# Patient Record
Sex: Male | Born: 1940
Health system: Southern US, Community
[De-identification: ages and names within clinical notes are randomized; demographics above are authoritative.]

## PROBLEM LIST (undated history)

## (undated) DIAGNOSIS — M179 Osteoarthritis of knee, unspecified: Secondary | ICD-10-CM

## (undated) DIAGNOSIS — E785 Hyperlipidemia, unspecified: Secondary | ICD-10-CM

## (undated) DIAGNOSIS — I25810 Atherosclerosis of coronary artery bypass graft(s) without angina pectoris: Secondary | ICD-10-CM

## (undated) DIAGNOSIS — M1710 Unilateral primary osteoarthritis, unspecified knee: Secondary | ICD-10-CM

## (undated) HISTORY — DX: Atherosclerosis of coronary artery bypass graft(s) without angina pectoris: I25.810

## (undated) HISTORY — PX: KNEE ARTHROSCOPY: SUR90

## (undated) HISTORY — DX: Unilateral primary osteoarthritis, unspecified knee: M17.10

## (undated) HISTORY — DX: Osteoarthritis of knee, unspecified: M17.9

## (undated) HISTORY — DX: Hyperlipidemia, unspecified: E78.5

## (undated) HISTORY — PX: CORONARY ARTERY BYPASS GRAFT: SHX141

---

## 2009-01-28 ENCOUNTER — Ambulatory Visit: Payer: Self-pay | Admitting: Cardiology

## 2009-02-03 ENCOUNTER — Ambulatory Visit: Payer: Self-pay | Admitting: Cardiology

## 2009-02-08 ENCOUNTER — Ambulatory Visit: Payer: Self-pay | Admitting: Cardiology

## 2009-02-08 ENCOUNTER — Inpatient Hospital Stay (HOSPITAL_COMMUNITY): Admission: RE | Admit: 2009-02-08 | Discharge: 2009-02-09 | Payer: Self-pay | Admitting: Cardiology

## 2009-02-08 ENCOUNTER — Ambulatory Visit: Payer: Self-pay | Admitting: Thoracic Surgery (Cardiothoracic Vascular Surgery)

## 2009-02-08 ENCOUNTER — Encounter: Payer: Self-pay | Admitting: Cardiology

## 2009-02-08 ENCOUNTER — Encounter: Payer: Self-pay | Admitting: Thoracic Surgery (Cardiothoracic Vascular Surgery)

## 2009-02-09 ENCOUNTER — Encounter: Payer: Self-pay | Admitting: Thoracic Surgery (Cardiothoracic Vascular Surgery)

## 2009-02-12 ENCOUNTER — Inpatient Hospital Stay (HOSPITAL_COMMUNITY)
Admission: RE | Admit: 2009-02-12 | Discharge: 2009-02-16 | Payer: Self-pay | Admitting: Thoracic Surgery (Cardiothoracic Vascular Surgery)

## 2009-02-15 ENCOUNTER — Encounter: Payer: Self-pay | Admitting: Cardiology

## 2009-03-12 ENCOUNTER — Ambulatory Visit: Payer: Self-pay | Admitting: Thoracic Surgery (Cardiothoracic Vascular Surgery)

## 2009-03-12 ENCOUNTER — Encounter: Payer: Self-pay | Admitting: Cardiology

## 2009-03-12 ENCOUNTER — Encounter
Admission: RE | Admit: 2009-03-12 | Discharge: 2009-03-12 | Payer: Self-pay | Admitting: Thoracic Surgery (Cardiothoracic Vascular Surgery)

## 2009-03-17 ENCOUNTER — Ambulatory Visit: Payer: Self-pay | Admitting: Cardiology

## 2009-06-02 ENCOUNTER — Encounter: Payer: Self-pay | Admitting: Cardiology

## 2009-09-11 DIAGNOSIS — I25810 Atherosclerosis of coronary artery bypass graft(s) without angina pectoris: Secondary | ICD-10-CM | POA: Insufficient documentation

## 2009-10-14 ENCOUNTER — Encounter (INDEPENDENT_AMBULATORY_CARE_PROVIDER_SITE_OTHER): Payer: Self-pay | Admitting: *Deleted

## 2009-10-15 ENCOUNTER — Ambulatory Visit: Payer: Self-pay | Admitting: Cardiology

## 2009-10-15 DIAGNOSIS — M1710 Unilateral primary osteoarthritis, unspecified knee: Secondary | ICD-10-CM

## 2009-10-15 DIAGNOSIS — E785 Hyperlipidemia, unspecified: Secondary | ICD-10-CM | POA: Insufficient documentation

## 2009-10-15 DIAGNOSIS — Z951 Presence of aortocoronary bypass graft: Secondary | ICD-10-CM

## 2009-10-15 DIAGNOSIS — I495 Sick sinus syndrome: Secondary | ICD-10-CM

## 2010-01-07 IMAGING — CR DG CHEST 1V PORT
1 series · 1 of 1 positions shown · non-contrast
Comparison: Chest radiograph 02/12/2009

CLINICAL DATA: CABG

PORTABLE CHEST - 1 VIEW

[view not recorded]
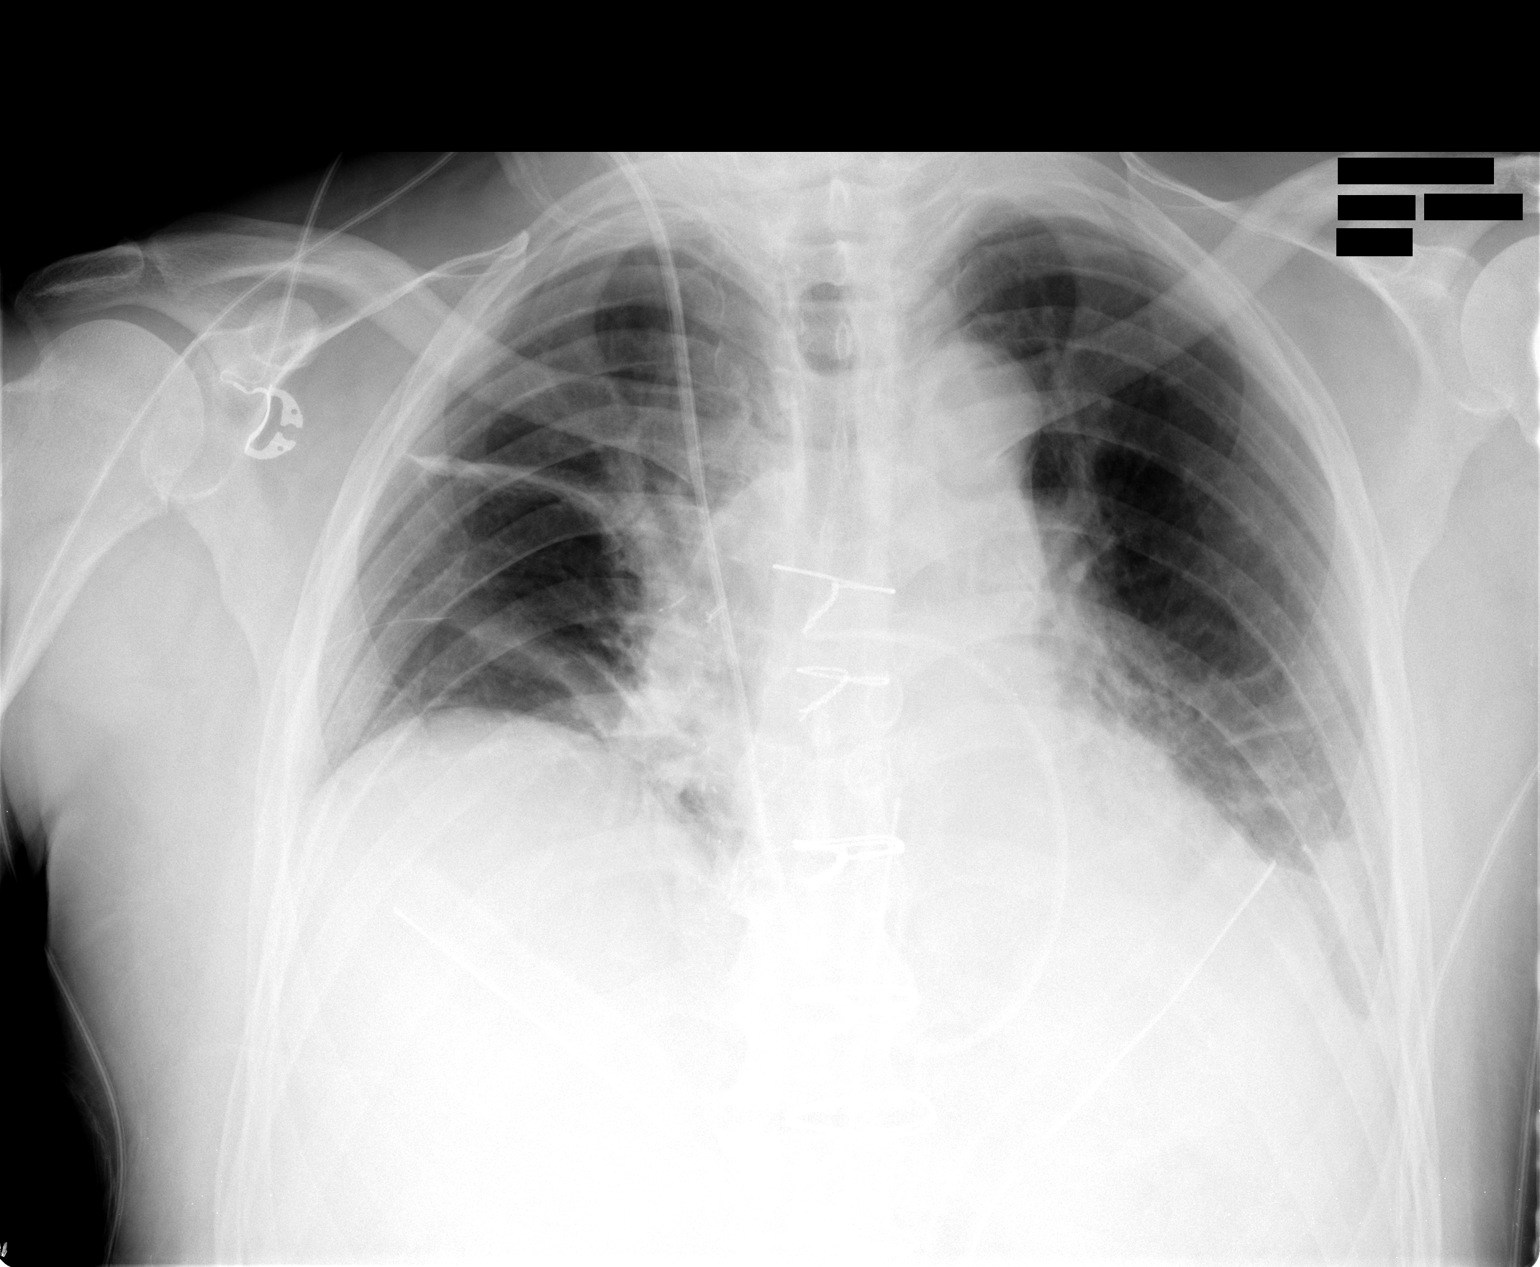

[1 of 1 positions shown; findings below may reference images not displayed]

FINDINGS: Midline sternotomy wires overlie stable cardiac
silhouette.  There is a Swan-Ganz catheter which tip has been
retracted into the more proximal right main pulmonary artery.
Bilateral chest tubes and mediastinal drain are unchanged.  There
is bibasilar atelectasis as well as a plate-like atelectasis in the
right lung which are not significantly changed.  Small left
effusion.
IMPRESSION: 1.  Adjustment of Swan-Ganz catheter as described.
2.  Bilateral atelectasis and left effusion.

## 2010-01-08 IMAGING — CR DG CHEST 1V PORT
1 series · 1 of 1 positions shown · non-contrast
Comparison: Chest radiograph 02/13/2009

CLINICAL DATA: CABG

PORTABLE CHEST - 1 VIEW

[view not recorded]
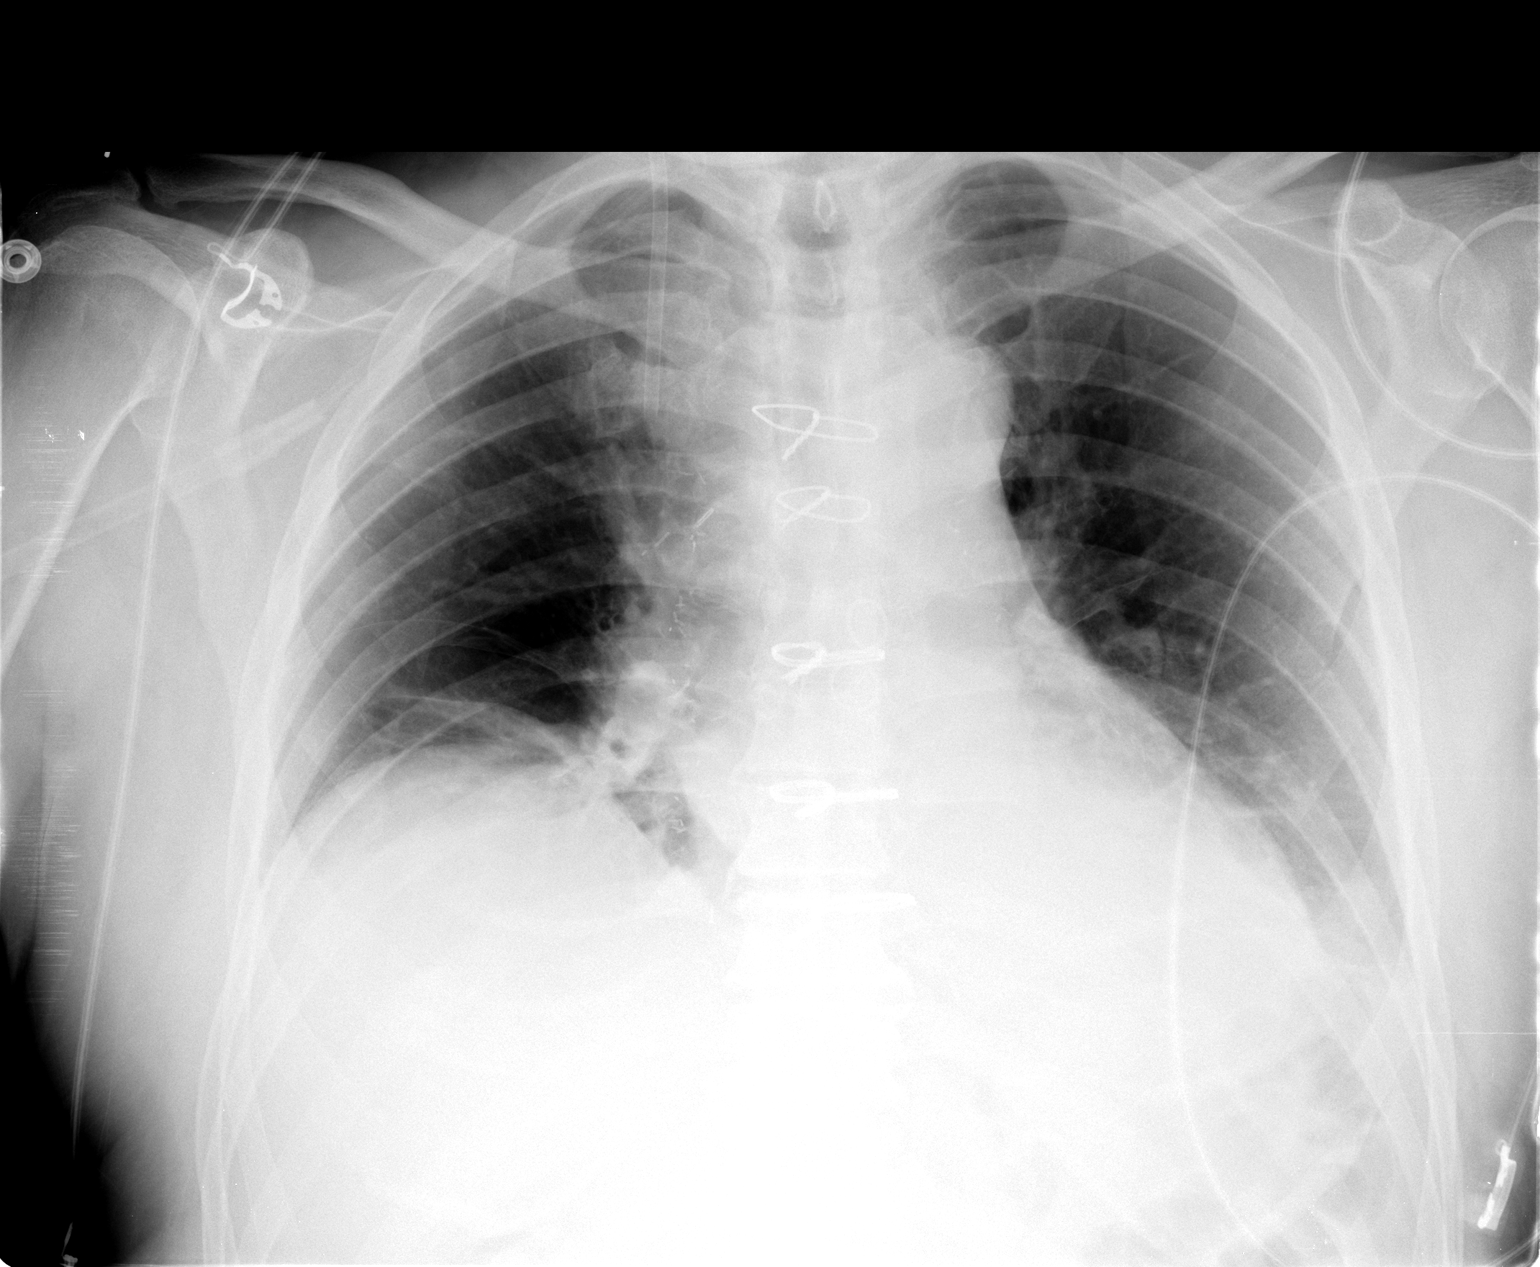

[1 of 1 positions shown; findings below may reference images not displayed]

FINDINGS: Sternotomy wires overlie stable enlarged cardiac
silhouette.  Interval retraction of Swan-Ganz catheter.  Right IJ
sheath remains.  There is bibasilar atelectasis and left effusion
not changed from prior.  No new pneumothorax. Oral removal of
bilateral chest tubes without evidence pneumothorax.
IMPRESSION: 1.  Retraction of Swan-Ganz catheter.
2.  Removal of chest tubes without evidence of pneumothorax.

## 2010-01-09 IMAGING — CR DG CHEST 2V
2 series · 2 of 2 positions shown · non-contrast
Comparison: 02/14/2009

CLINICAL DATA: CAD.  CABG.

CHEST - 2 VIEW

[w chest pa]
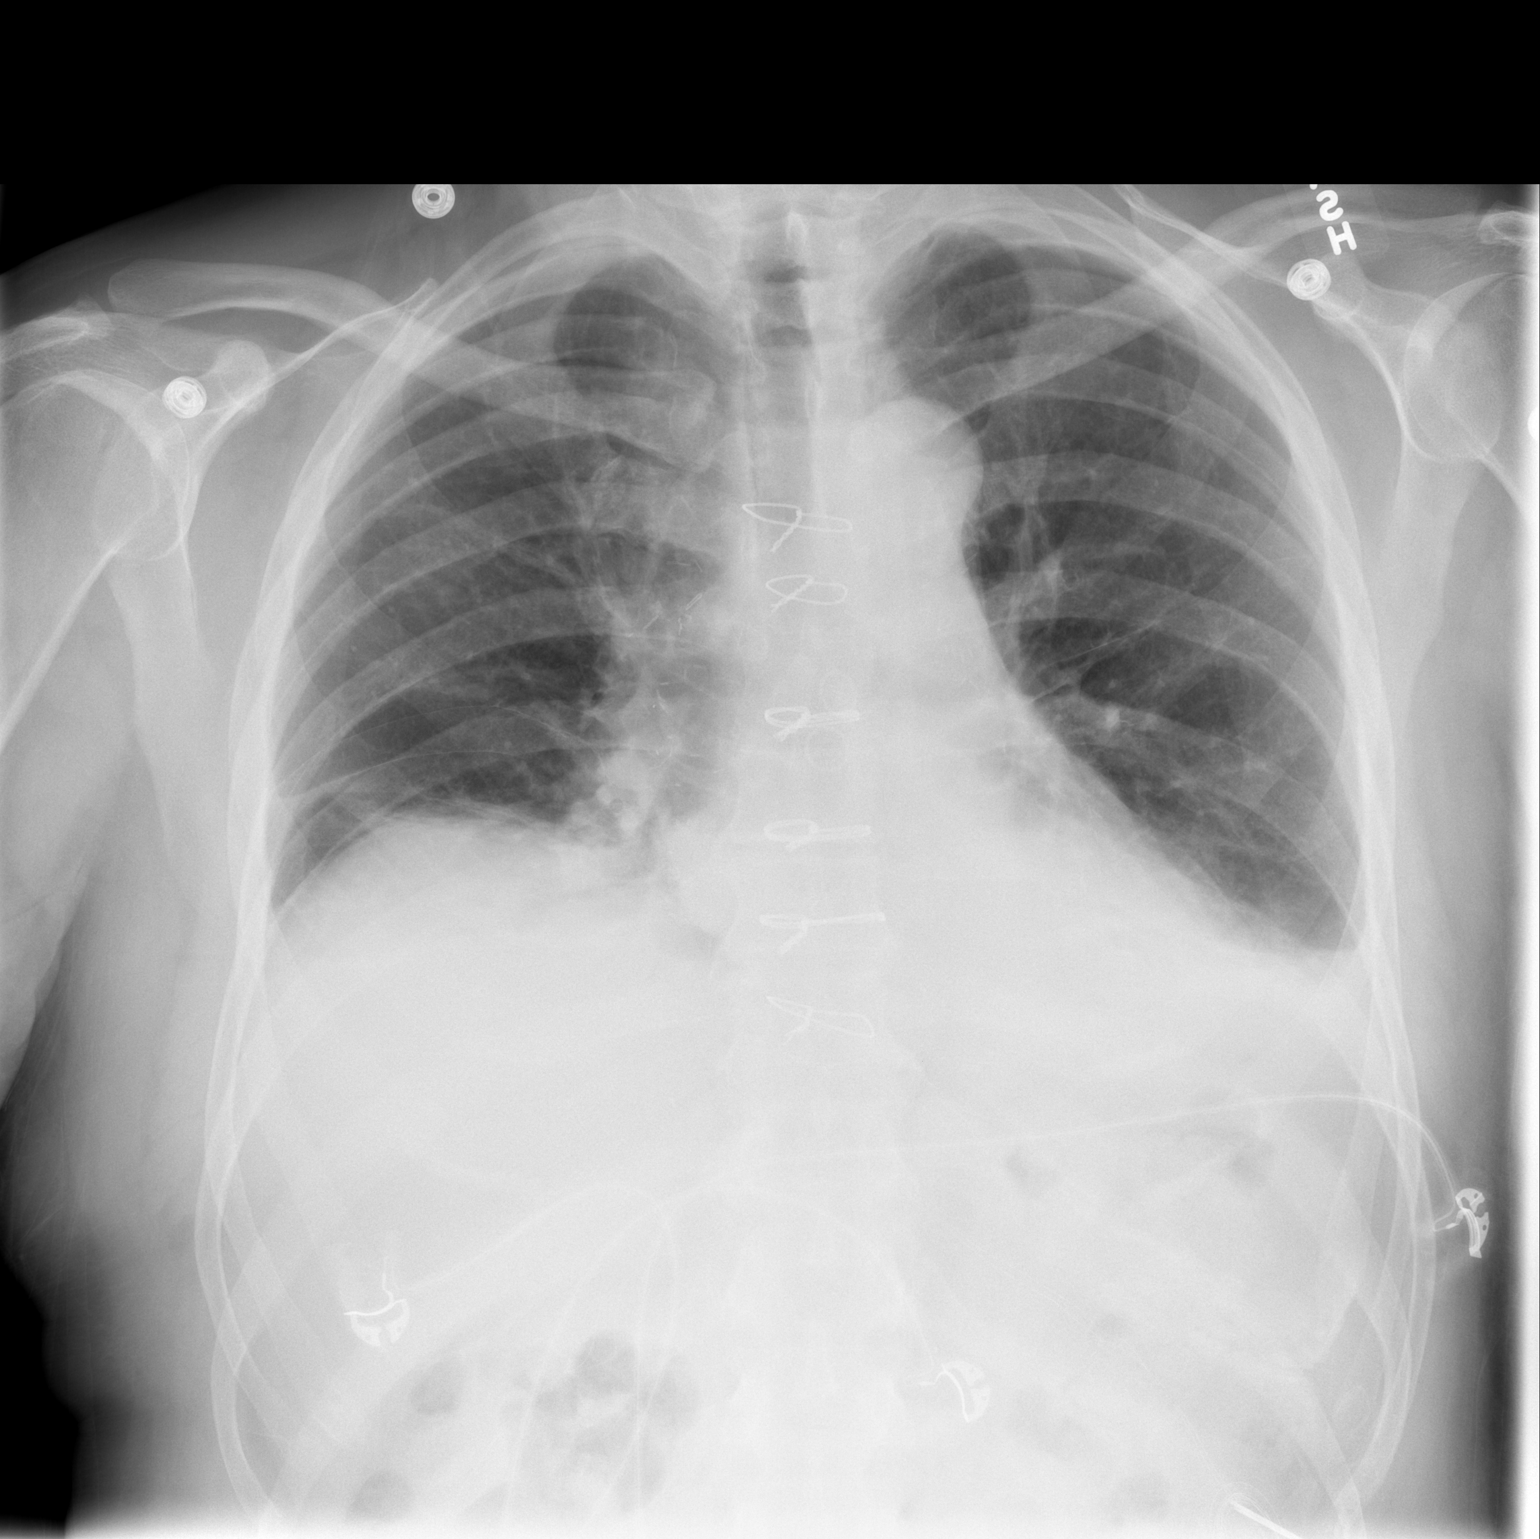

[w chest lat]
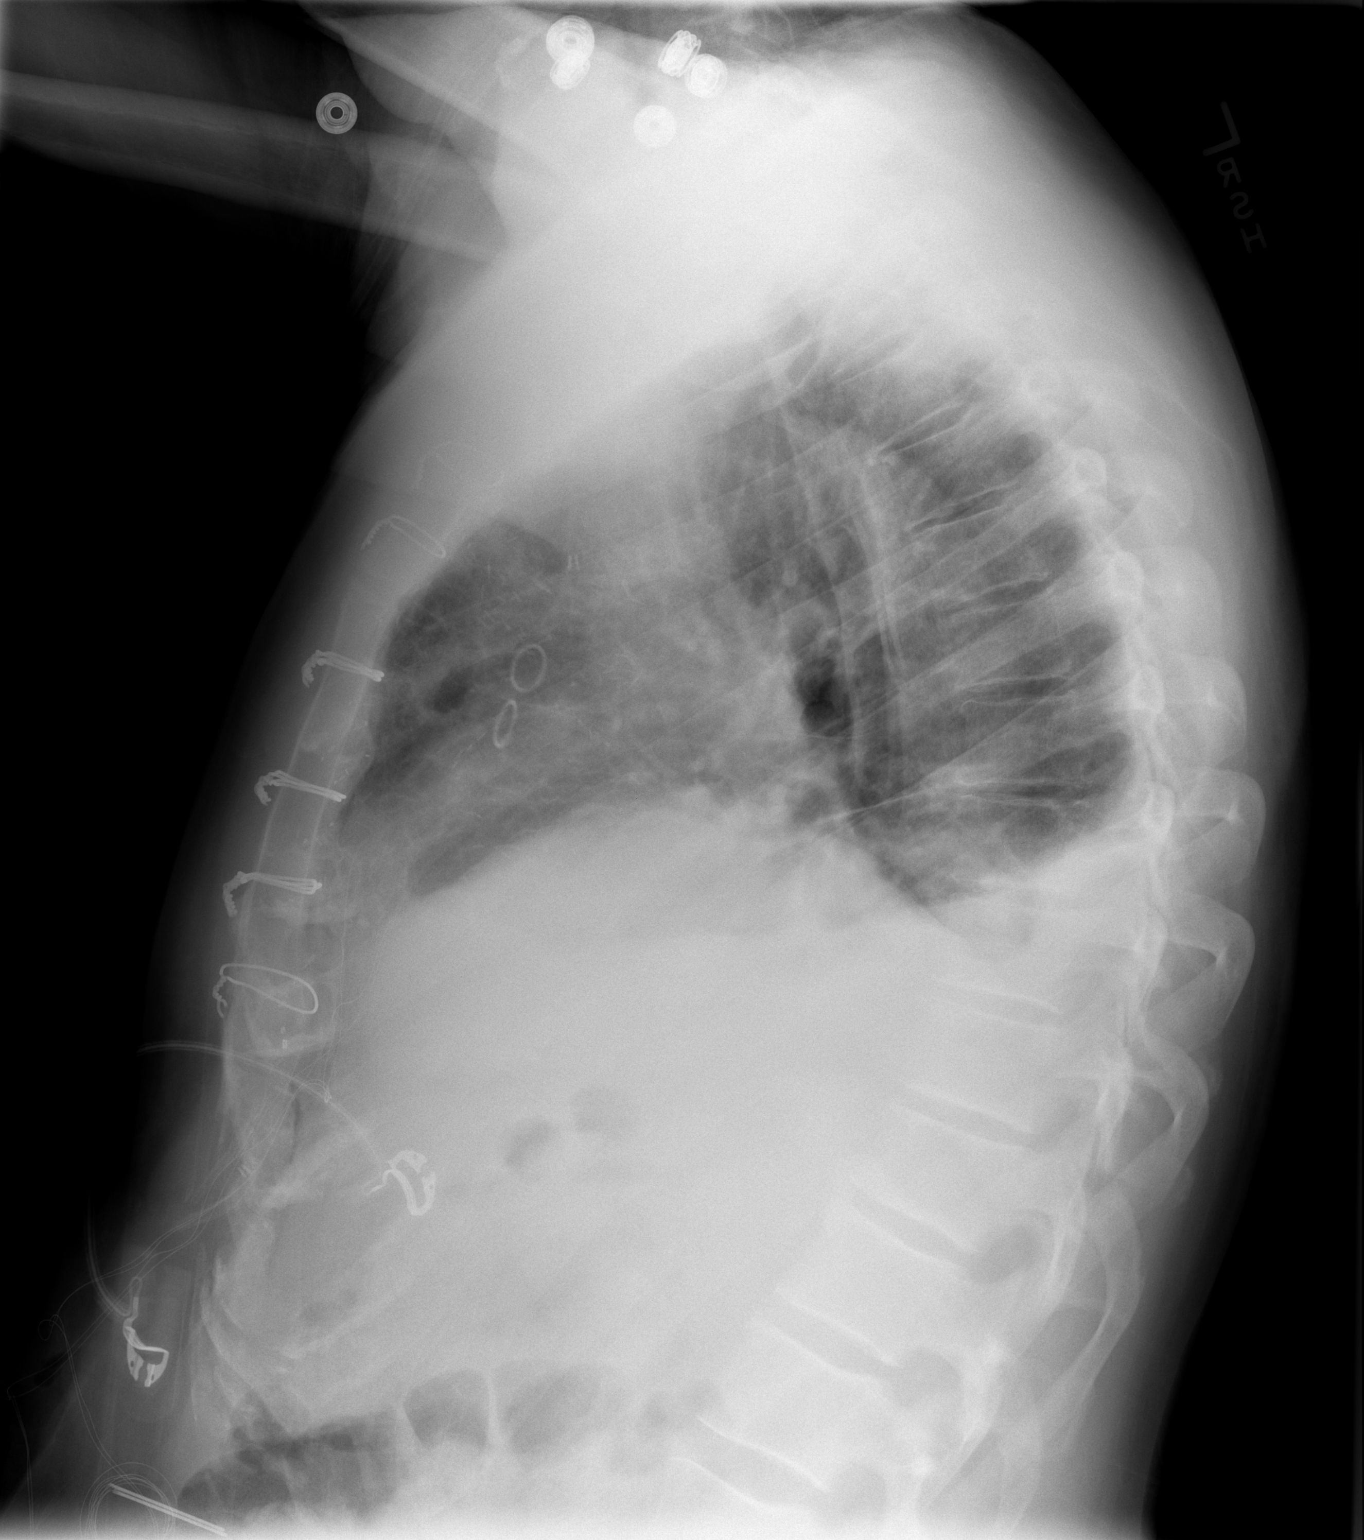

[2 of 2 positions shown; findings below may reference images not displayed]

FINDINGS: Small pleural effusions.  Atelectasis at the bases is
again noted.  Slight improved aeration at the bases.  Swan-Ganz
catheter sheath has been removed.  No pneumothorax.
IMPRESSION: Small pleural effusions.  Mild atelectasis at the bases.  No
pneumothorax.

## 2010-05-10 ENCOUNTER — Ambulatory Visit: Payer: Self-pay | Admitting: Thoracic Surgery (Cardiothoracic Vascular Surgery)

## 2010-07-19 ENCOUNTER — Encounter: Payer: Self-pay | Admitting: Cardiology

## 2010-10-04 ENCOUNTER — Encounter: Payer: Self-pay | Admitting: Cardiology

## 2010-10-20 ENCOUNTER — Ambulatory Visit: Payer: Self-pay | Admitting: Cardiology

## 2010-12-04 HISTORY — PX: JOINT REPLACEMENT: SHX530

## 2011-01-03 NOTE — Letter (Signed)
Summary: External Correspondence/ [facility]  External Correspondence/ [facility]   Imported By: Dorise Hiss 10/11/2010 10:52:24  _____________________________________________________________________  External Attachment:    Type:   Image     Comment:   External Document

## 2011-01-03 NOTE — Assessment & Plan Note (Signed)
Summary: 1 YR FU REMINDER-SRS  Medications Added CRESTOR 40 MG TABS (ROSUVASTATIN CALCIUM) Take 1 /2 tablet by mouth once a day VITAMIN D3 1000 UNIT TABS (CHOLECALCIFEROL) Take 1 tablet by mouth once a day FISH OIL 1000 MG CAPS (OMEGA-3 FATTY ACIDS) Take 1 tablet by mouth once a day      Allergies Added: ! MORPHINE  Visit Type:  Follow-up Primary Danial Hlavac:  Reuel Boom   History of Present Illness: the patient is a pleasant 70 year old male with a history of coronary artery disease. The patient status post coronary bypass grafting in March of 2010. He underwent 5 vessel bypass grafting.patient had laboratory work done in August of this year. Renal function is within normal limits. Electrolytes are within normal limits. Total cholesterol was 131, triglycerides 57 HDL cholesterol 61 and LDL cholesterol 59 on 20 mg of Crestor.  cardiac rehab continue. Works with horses. No scp. No sob.watches his food. Broiled and baked. No fats no meats.  No palpitations or syncope. Diziness with cough.  Ecg normal. Not on metoprololnymore.   Preventive Screening-Counseling & Management  Alcohol-Tobacco     Smoking Status: never  Current Medications (verified): 1)  Crestor 40 Mg Tabs (Rosuvastatin Calcium) .... Take 1 /2 Tablet By Mouth Once A Day 2)  Pantoprazole Sodium 40 Mg Tbec (Pantoprazole Sodium) .... Take 1 Tablet By Mouth Once A Day 3)  Tylenol Arthritis Pain 650 Mg Cr-Tabs (Acetaminophen) .... Take 2 Tablets By Mouth Two Times A Day 4)  Glucosamine 500 Mg Caps (Glucosamine Sulfate) .... Take 1 Capsule By Mouth Two Times A Day 5)  Vitamin D3 1000 Unit Tabs (Cholecalciferol) .... Take 1 Tablet By Mouth Once A Day 6)  Multivitamins  Tabs (Multiple Vitamin) .... Take 1 Tablet By Mouth Once A Day 7)  Aspirin Ec 325 Mg Tbec (Aspirin) .... Take One Tablet By Mouth Daily 8)  Fish Oil 1000 Mg Caps (Omega-3 Fatty Acids) .... Take 1 Tablet By Mouth Once A Day  Allergies (verified): 1)  !  Morphine  Comments:  Nurse/Medical Assistant: The patient's medication list and allergies were reviewed with the patient and were updated in the Medication and Allergy Lists.  Past History:  Past Medical History: Last updated: 10/15/2009 CAD, ARTERY BYPASS GRAFT (ICD-414.04) 1. Status post coronary artery bypass grafting. 2. Normal left ventricular function. 3. Increased left atrial dilatation. 4. Scheduled elective surgery for bilateral knee replacement secondary     to osteoarthritis.   Past Surgical History: Last updated: 09/11/2009 Knee Arthroscopy oral CABG  Family History: Last updated: 09/11/2009 Family History of Coronary Artery Disease:   Social History: Last updated: 09/11/2009 Tobacco Use - No.  Alcohol Use - no Drug Use - no  Risk Factors: Smoking Status: never (10/20/2010)  Past Cardiac History:  Cardiac Interventions: PRIMARY ADMITTING DIAGNOSIS:  Severe three-vessel coronary artery disease.   ADDITIONAL/DISCHARGE DIAGNOSES: 1. Severe three-vessel coronary artery disease. 2. History of gastroesophageal reflux. 3. Arthritis. 4. Depression. 5. History of erectile dysfunction. 6. Status post esophageal dilatation. 7. Peptic ulcer disease.   PROCEDURES PERFORMED: 1. Coronary artery bypass grafting x5 (left internal mammary artery to     the left anterior descending, right internal mammary artery to the     distal right coronary artery, saphenous vein graft to the first     diagonal, sequential saphenous vein graft to the first and second     obtuse marginals). 2. Endoscopic vein harvest, right thigh.  02/15/2009:  CABG-4 vessels (coronary artery bypass grafting x5)  Review  of Systems  The patient denies fatigue, malaise, fever, weight gain/loss, vision loss, decreased hearing, hoarseness, chest pain, palpitations, shortness of breath, prolonged cough, wheezing, sleep apnea, coughing up blood, abdominal pain, blood in stool, nausea, vomiting,  diarrhea, heartburn, incontinence, blood in urine, muscle weakness, joint pain, leg swelling, rash, skin lesions, headache, fainting, dizziness, depression, anxiety, enlarged lymph nodes, easy bruising or bleeding, and environmental allergies.    Vital Signs:  Patient profile:   70 year old male Height:      70 inches Weight:      199 pounds BMI:     28.66 Pulse rate:   63 / minute BP sitting:   138 / 84  (left arm) Cuff size:   regular  Vitals Entered By: Carlye Grippe (October 20, 2010 2:11 PM)  Nutrition Counseling: Patient's BMI is greater than 25 and therefore counseled on weight management options.  Physical Exam  Additional Exam:  General: Well-developed, well-nourished in no distress head: Normocephalic and atraumatic eyes PERRLA/EOMI intact, conjunctiva and lids normal nose: No deformity or lesions mouth normal dentition, normal posterior pharynx neck: Supple, no JVD.  No masses, thyromegaly or abnormal cervical nodes lungs: Normal breath sounds bilaterally without wheezing.  Normal percussion heart: regular rate and rhythm with normal S1 and S2, no S3 or S4.  PMI is normal.  No pathological murmurs abdomen: Normal bowel sounds, abdomen is soft and nontender without masses, organomegaly or hernias noted.  No hepatosplenomegaly musculoskeletal: Back normal, normal gait muscle strength and tone normal pulsus: Pulse is normal in all 4 extremities Extremities: No peripheral pitting edema neurologic: Alert and oriented x 3 skin: Intact without lesions or rashes cervical nodes: No significant adenopathy psychologic: Normal affect    EKG  Procedure date:  10/20/2010  Findings:      normal sinus rhythm no acute ischemic changes.  Impression & Recommendations:  Problem # 1:  CAD, ARTERY BYPASS GRAFT (ICD-414.04) the patient is status post coronary bypass grafting and doing well. He reports no chest pain or short of breath. He does not need ischemia testing at the  present time. The following medications were removed from the medication list:    Metoprolol Succinate 25 Mg Xr24h-tab (Metoprolol succinate) .Marland Kitchen... Take one tablet by mouth daily His updated medication list for this problem includes:    Aspirin Ec 325 Mg Tbec (Aspirin) .Marland Kitchen... Take one tablet by mouth daily  Orders: EKG w/ Interpretation (93000)  Problem # 2:  SINUS BRADYCARDIA (ICD-427.81) patient had sinus bradycardia and is currently not on metoprolol anymore. The following medications were removed from the medication list:    Metoprolol Succinate 25 Mg Xr24h-tab (Metoprolol succinate) .Marland Kitchen... Take one tablet by mouth daily His updated medication list for this problem includes:    Aspirin Ec 325 Mg Tbec (Aspirin) .Marland Kitchen... Take one tablet by mouth daily  Problem # 3:  DYSLIPIDEMIA (ICD-272.4) lipid panel is remarkably good. LDL cholesterol is 59 HDL cholesterol is 61. Continue Crestor 20 mg p.o. q. daily His updated medication list for this problem includes:    Crestor 40 Mg Tabs (Rosuvastatin calcium) .Marland Kitchen... Take 1 /2 tablet by mouth once a day  Patient Instructions: 1)  Your physician recommends that you continue on your current medications as directed. Please refer to the Current Medication list given to you today. 2)  Follow up in  6 months

## 2011-01-26 ENCOUNTER — Encounter: Payer: Self-pay | Admitting: Cardiology

## 2011-02-21 NOTE — Letter (Signed)
Summary: External Correspondence/  OV DR. DANIEL  External Correspondence/  OV DR. DANIEL   Imported By: Dorise Hiss 02/15/2011 10:24:18  _____________________________________________________________________  External Attachment:    Type:   Image     Comment:   External Document

## 2011-03-16 LAB — CBC
HCT: 28.9 % — ABNORMAL LOW (ref 39.0–52.0)
HCT: 32.4 % — ABNORMAL LOW (ref 39.0–52.0)
HCT: 40.5 % (ref 39.0–52.0)
Hemoglobin: 10.8 g/dL — ABNORMAL LOW (ref 13.0–17.0)
Hemoglobin: 12.9 g/dL — ABNORMAL LOW (ref 13.0–17.0)
Hemoglobin: 13.9 g/dL (ref 13.0–17.0)
MCHC: 34.2 g/dL (ref 30.0–36.0)
MCHC: 34.7 g/dL (ref 30.0–36.0)
MCHC: 34.8 g/dL (ref 30.0–36.0)
MCHC: 34.8 g/dL (ref 30.0–36.0)
MCHC: 34.9 g/dL (ref 30.0–36.0)
MCHC: 35 g/dL (ref 30.0–36.0)
MCV: 89.3 fL (ref 78.0–100.0)
MCV: 89.6 fL (ref 78.0–100.0)
MCV: 89.6 fL (ref 78.0–100.0)
MCV: 89.9 fL (ref 78.0–100.0)
MCV: 90.2 fL (ref 78.0–100.0)
MCV: 90.4 fL (ref 78.0–100.0)
MCV: 90.6 fL (ref 78.0–100.0)
Platelets: 123 10*3/uL — ABNORMAL LOW (ref 150–400)
Platelets: 138 10*3/uL — ABNORMAL LOW (ref 150–400)
Platelets: 149 10*3/uL — ABNORMAL LOW (ref 150–400)
Platelets: 206 10*3/uL (ref 150–400)
Platelets: 228 10*3/uL (ref 150–400)
RBC: 3.48 MIL/uL — ABNORMAL LOW (ref 4.22–5.81)
RBC: 4.12 MIL/uL — ABNORMAL LOW (ref 4.22–5.81)
RBC: 4.48 MIL/uL (ref 4.22–5.81)
RDW: 12.8 % (ref 11.5–15.5)
RDW: 12.9 % (ref 11.5–15.5)
RDW: 12.9 % (ref 11.5–15.5)
RDW: 12.9 % (ref 11.5–15.5)
WBC: 10.7 10*3/uL — ABNORMAL HIGH (ref 4.0–10.5)
WBC: 11.2 10*3/uL — ABNORMAL HIGH (ref 4.0–10.5)
WBC: 9.5 10*3/uL (ref 4.0–10.5)

## 2011-03-16 LAB — BASIC METABOLIC PANEL
BUN: 7 mg/dL (ref 6–23)
BUN: 11 mg/dL (ref 6–23)
BUN: 7 mg/dL (ref 6–23)
BUN: 7 mg/dL (ref 6–23)
CO2: 23 mEq/L (ref 19–32)
CO2: 24 mEq/L (ref 19–32)
CO2: 27 mEq/L (ref 19–32)
Calcium: 8 mg/dL — ABNORMAL LOW (ref 8.4–10.5)
Calcium: 8.3 mg/dL — ABNORMAL LOW (ref 8.4–10.5)
Calcium: 9.1 mg/dL (ref 8.4–10.5)
Chloride: 107 mEq/L (ref 96–112)
Chloride: 104 mEq/L (ref 96–112)
Chloride: 106 mEq/L (ref 96–112)
Chloride: 111 mEq/L (ref 96–112)
Creatinine, Ser: 0.81 mg/dL (ref 0.4–1.5)
Creatinine, Ser: 0.65 mg/dL (ref 0.4–1.5)
Creatinine, Ser: 0.69 mg/dL (ref 0.4–1.5)
Creatinine, Ser: 0.72 mg/dL (ref 0.4–1.5)
Creatinine, Ser: 0.92 mg/dL (ref 0.4–1.5)
GFR calc Af Amer: >60 mL/min (ref >60)
GFR calc Af Amer: >60 mL/min (ref >60)
GFR calc non Af Amer: >60 mL/min (ref >60)
GFR calc non Af Amer: >60 mL/min (ref >60)
Glucose, Bld: 104 mg/dL — ABNORMAL HIGH (ref 70–99)
Glucose, Bld: 113 mg/dL — ABNORMAL HIGH (ref 70–99)
Glucose, Bld: 114 mg/dL — ABNORMAL HIGH (ref 70–99)
Potassium: 3.9 mEq/L (ref 3.5–5.1)
Potassium: 3.9 mEq/L (ref 3.5–5.1)

## 2011-03-16 LAB — POCT I-STAT 3, ART BLOOD GAS (G3+)
Acid-base deficit: 2 mmol/L (ref 0.0–2.0)
Bicarbonate: 20 mEq/L (ref 20.0–24.0)
O2 Saturation: 100 %
O2 Saturation: 99 %
Patient temperature: 35.7
TCO2: 21 mmol/L (ref 0–100)
TCO2: 23 mmol/L (ref 0–100)
pCO2 arterial: 36.2 mmHg (ref 35.0–45.0)
pH, Arterial: 7.394 (ref 7.350–7.450)
pO2, Arterial: 134 mmHg — ABNORMAL HIGH (ref 80.0–100.0)

## 2011-03-16 LAB — POCT I-STAT 4, (NA,K, GLUC, HGB,HCT)
Glucose, Bld: 105 mg/dL — ABNORMAL HIGH (ref 70–99)
Glucose, Bld: 105 mg/dL — ABNORMAL HIGH (ref 70–99)
Glucose, Bld: 110 mg/dL — ABNORMAL HIGH (ref 70–99)
HCT: 25 % — ABNORMAL LOW (ref 39.0–52.0)
HCT: 27 % — ABNORMAL LOW (ref 39.0–52.0)
HCT: 40 % (ref 39.0–52.0)
Hemoglobin: 13.6 g/dL (ref 13.0–17.0)
Hemoglobin: 14.6 g/dL (ref 13.0–17.0)
Hemoglobin: 8.5 g/dL — ABNORMAL LOW (ref 13.0–17.0)
Potassium: 3.4 mEq/L — ABNORMAL LOW (ref 3.5–5.1)
Potassium: 3.4 mEq/L — ABNORMAL LOW (ref 3.5–5.1)
Potassium: 3.6 mEq/L (ref 3.5–5.1)
Potassium: 4 mEq/L (ref 3.5–5.1)
Sodium: 135 mEq/L (ref 135–145)
Sodium: 136 mEq/L (ref 135–145)
Sodium: 140 mEq/L (ref 135–145)

## 2011-03-16 LAB — BLOOD GAS, ARTERIAL
Acid-Base Excess: 1.1 mmol/L (ref 0.0–2.0)
Patient temperature: 98.6
pCO2 arterial: 40.1 mmHg (ref 35.0–45.0)
pH, Arterial: 7.415 (ref 7.350–7.450)

## 2011-03-16 LAB — GLUCOSE, CAPILLARY
Glucose-Capillary: 100 mg/dL — ABNORMAL HIGH (ref 70–99)
Glucose-Capillary: 101 mg/dL — ABNORMAL HIGH (ref 70–99)
Glucose-Capillary: 116 mg/dL — ABNORMAL HIGH (ref 70–99)
Glucose-Capillary: 121 mg/dL — ABNORMAL HIGH (ref 70–99)
Glucose-Capillary: 84 mg/dL (ref 70–99)

## 2011-03-16 LAB — CREATININE, SERUM: GFR calc Af Amer: >60 mL/min (ref >60)

## 2011-03-16 LAB — URINALYSIS, ROUTINE W REFLEX MICROSCOPIC
Bilirubin Urine: NEGATIVE
Glucose, UA: NEGATIVE mg/dL
Ketones, ur: NEGATIVE mg/dL
Protein, ur: NEGATIVE mg/dL
pH: 6 (ref 5.0–8.0)

## 2011-03-16 LAB — MAGNESIUM
Magnesium: 2 mg/dL (ref 1.5–2.5)
Magnesium: 2.1 mg/dL (ref 1.5–2.5)

## 2011-03-16 LAB — TYPE AND SCREEN
ABO/RH(D): O NEG
Antibody Screen: NEGATIVE

## 2011-03-16 LAB — POCT I-STAT, CHEM 8
BUN: 12 mg/dL (ref 6–23)
Calcium, Ion: 1.23 mmol/L (ref 1.12–1.32)
Glucose, Bld: 134 mg/dL — ABNORMAL HIGH (ref 70–99)
TCO2: 23 mmol/L (ref 0–100)

## 2011-03-16 LAB — COMPREHENSIVE METABOLIC PANEL
ALT: 17 U/L (ref 0–53)
Albumin: 3.4 g/dL — ABNORMAL LOW (ref 3.5–5.2)
Alkaline Phosphatase: 52 U/L (ref 39–117)
Calcium: 9.1 mg/dL (ref 8.4–10.5)
GFR calc Af Amer: >60 mL/min (ref >60)
GFR calc non Af Amer: >60 mL/min (ref >60)
Glucose, Bld: 105 mg/dL — ABNORMAL HIGH (ref 70–99)
Sodium: 140 mEq/L (ref 135–145)
Total Bilirubin: 0.9 mg/dL (ref 0.3–1.2)
Total Protein: 5.9 g/dL — ABNORMAL LOW (ref 6.0–8.3)

## 2011-03-16 LAB — DIFFERENTIAL
Basophils Absolute: 0 10*3/uL (ref 0.0–0.1)
Basophils Relative: 1 % (ref 0–1)
Eosinophils Absolute: 0.1 10*3/uL (ref 0.0–0.7)
Neutro Abs: 3.5 10*3/uL (ref 1.7–7.7)
Neutrophils Relative %: 55 % (ref 43–77)

## 2011-03-16 LAB — ABO/RH: ABO/RH(D): O NEG

## 2011-03-16 LAB — PROTIME-INR
INR: 1 (ref 0.00–1.49)
INR: 1.3 (ref 0.00–1.49)
Prothrombin Time: 13.5 seconds (ref 11.6–15.2)

## 2011-03-16 LAB — APTT: aPTT: 33 seconds (ref 24–37)

## 2011-03-18 ENCOUNTER — Telehealth: Payer: Self-pay | Admitting: *Deleted

## 2011-03-18 NOTE — Telephone Encounter (Signed)
Received fax requesting cardiac clearance for Left TKA-Medial & Lateral w/wo Patella Resurfacing.  Looks like patient is only on Aspirin & las t seen 10/2010.  Has 6 month f/u scheduled for June.

## 2011-03-20 NOTE — Telephone Encounter (Signed)
If patient has no unstable chest pain symptoms, then he should be able to be cleared for total knee replacement. He had bypass surgery less than 2 years ago and therefore does not need any stress testing prior to the procedure.

## 2011-03-22 ENCOUNTER — Telehealth: Payer: Self-pay | Admitting: Cardiology

## 2011-03-23 NOTE — Telephone Encounter (Signed)
Note faxed regarding below.

## 2011-03-29 NOTE — Telephone Encounter (Signed)
Received call that [name] did not receive fax for clearance. Refaxed today.

## 2011-03-30 ENCOUNTER — Other Ambulatory Visit: Payer: Self-pay | Admitting: Orthopedic Surgery

## 2011-03-30 ENCOUNTER — Ambulatory Visit (HOSPITAL_COMMUNITY)
Admission: RE | Admit: 2011-03-30 | Discharge: 2011-03-30 | Disposition: A | Discharge status: Home / Self Care | Payer: BC Managed Care – PPO | Source: Ambulatory Visit | Attending: Orthopedic Surgery | Admitting: Orthopedic Surgery

## 2011-03-30 ENCOUNTER — Encounter (HOSPITAL_COMMUNITY): Payer: BC Managed Care – PPO

## 2011-03-30 ENCOUNTER — Other Ambulatory Visit (HOSPITAL_COMMUNITY): Payer: Self-pay | Admitting: Orthopedic Surgery

## 2011-03-30 DIAGNOSIS — M1710 Unilateral primary osteoarthritis, unspecified knee: Secondary | ICD-10-CM | POA: Insufficient documentation

## 2011-03-30 DIAGNOSIS — I2510 Atherosclerotic heart disease of native coronary artery without angina pectoris: Secondary | ICD-10-CM | POA: Insufficient documentation

## 2011-03-30 DIAGNOSIS — Z01811 Encounter for preprocedural respiratory examination: Secondary | ICD-10-CM

## 2011-03-30 DIAGNOSIS — Z951 Presence of aortocoronary bypass graft: Secondary | ICD-10-CM | POA: Insufficient documentation

## 2011-03-30 DIAGNOSIS — Z01818 Encounter for other preprocedural examination: Secondary | ICD-10-CM | POA: Insufficient documentation

## 2011-03-30 DIAGNOSIS — Z01812 Encounter for preprocedural laboratory examination: Secondary | ICD-10-CM | POA: Insufficient documentation

## 2011-03-30 LAB — COMPREHENSIVE METABOLIC PANEL
ALT: 23 U/L (ref 0–53)
AST: 20 U/L (ref 0–37)
Alkaline Phosphatase: 63 U/L (ref 39–117)
Calcium: 9 mg/dL (ref 8.4–10.5)
GFR calc Af Amer: >60 mL/min (ref >60)
Potassium: 4 mEq/L (ref 3.5–5.1)
Sodium: 138 mEq/L (ref 135–145)
Total Protein: 7.1 g/dL (ref 6.0–8.3)

## 2011-03-30 LAB — CBC
HCT: 43.5 % (ref 39.0–52.0)
Hemoglobin: 14.6 g/dL (ref 13.0–17.0)
MCHC: 33.6 g/dL (ref 30.0–36.0)
RDW: 12.7 % (ref 11.5–15.5)
WBC: 8.5 10*3/uL (ref 4.0–10.5)

## 2011-03-30 LAB — URINALYSIS, ROUTINE W REFLEX MICROSCOPIC
Glucose, UA: NEGATIVE mg/dL
Ketones, ur: NEGATIVE mg/dL
Nitrite: NEGATIVE
pH: 5.5 (ref 5.0–8.0)

## 2011-03-30 LAB — SURGICAL PCR SCREEN
MRSA, PCR: NEGATIVE
Staphylococcus aureus: POSITIVE — AB

## 2011-04-03 ENCOUNTER — Inpatient Hospital Stay (HOSPITAL_COMMUNITY)
Admission: RE | Admit: 2011-04-03 | Discharge: 2011-04-06 | DRG: 209 | Disposition: A | Discharge status: Skilled Nursing Facility | Payer: BC Managed Care – PPO | Source: Ambulatory Visit | Attending: Orthopedic Surgery | Admitting: Orthopedic Surgery

## 2011-04-03 DIAGNOSIS — E871 Hypo-osmolality and hyponatremia: Secondary | ICD-10-CM | POA: Diagnosis not present

## 2011-04-03 DIAGNOSIS — M1710 Unilateral primary osteoarthritis, unspecified knee: Principal | ICD-10-CM | POA: Diagnosis present

## 2011-04-03 DIAGNOSIS — I2510 Atherosclerotic heart disease of native coronary artery without angina pectoris: Secondary | ICD-10-CM | POA: Diagnosis present

## 2011-04-03 DIAGNOSIS — K219 Gastro-esophageal reflux disease without esophagitis: Secondary | ICD-10-CM | POA: Diagnosis present

## 2011-04-03 DIAGNOSIS — M129 Arthropathy, unspecified: Secondary | ICD-10-CM | POA: Diagnosis present

## 2011-04-03 DIAGNOSIS — Z951 Presence of aortocoronary bypass graft: Secondary | ICD-10-CM

## 2011-04-03 DIAGNOSIS — E876 Hypokalemia: Secondary | ICD-10-CM | POA: Diagnosis not present

## 2011-04-03 LAB — TYPE AND SCREEN
ABO/RH(D): O NEG
Antibody Screen: NEGATIVE

## 2011-04-03 LAB — ABO/RH: ABO/RH(D): O NEG

## 2011-04-04 LAB — BASIC METABOLIC PANEL
CO2: 28 mEq/L (ref 19–32)
Calcium: 8.1 mg/dL — ABNORMAL LOW (ref 8.4–10.5)
Creatinine, Ser: 0.71 mg/dL (ref 0.4–1.5)
GFR calc Af Amer: >60 mL/min (ref >60)
GFR calc non Af Amer: >60 mL/min (ref >60)

## 2011-04-04 LAB — CBC
MCH: 29.3 pg (ref 26.0–34.0)
MCHC: 33 g/dL (ref 30.0–36.0)
Platelets: 176 10*3/uL (ref 150–400)
RDW: 12.8 % (ref 11.5–15.5)

## 2011-04-05 LAB — BASIC METABOLIC PANEL
Chloride: 101 mEq/L (ref 96–112)
Creatinine, Ser: 0.66 mg/dL (ref 0.4–1.5)
GFR calc Af Amer: >60 mL/min (ref >60)
Sodium: 136 mEq/L (ref 135–145)

## 2011-04-05 LAB — CBC
MCH: 29.7 pg (ref 26.0–34.0)
Platelets: 167 10*3/uL (ref 150–400)
RBC: 3.84 MIL/uL — ABNORMAL LOW (ref 4.22–5.81)
WBC: 8.2 10*3/uL (ref 4.0–10.5)

## 2011-04-05 NOTE — Op Note (Signed)
[name], [name] [name]                 ACCOUNT NO.:  [id number]  MEDICAL RECORD NO.:  [id number]           PATIENT TYPE:  I  LOCATION:  1612                         FACILITY:  [facility]  PHYSICIAN:  Ollen Gross, M.D.    DATE OF BIRTH:  [birth year]  DATE OF PROCEDURE:  04/03/2011 DATE OF DISCHARGE:                              OPERATIVE REPORT   PREOPERATIVE DIAGNOSIS:  Osteoarthritis, left knee.  POSTOPERATIVE DIAGNOSIS:  Osteoarthritis, left knee.  PROCEDURE:  Left total knee arthroplasty.  SURGEON:  Ollen Gross, MD  ASSISTANT:  Alexzandrew L. Perkins, PA-C  ANESTHESIA:  Spinal.  ESTIMATED BLOOD LOSS:  Minimal.  DRAIN:  Hemovac x1.  TOURNIQUET TIME:  38 minutes at 300 mmHg.  COMPLICATIONS:  None.  CONDITION:  Stable to Recovery.  CLINICAL NOTE:  [name] is a 70 year old male with severe end-stage arthritis of both knees, left more symptomatic than the right.  He has failed nonoperative management and presents now for left total knee arthroplasty.  PROCEDURE IN DETAIL:  After successful administration of spinal anesthetic, a tourniquet is placed high on his left thigh and his left lower extremity was prepped and draped in the usual sterile fashion. Extremities wrapped in Esmarch, knee flexed, tourniquet inflated to 300 mmHg.  Midline incision was made with a 10 blade through subcutaneous tissue to the level of the extensor mechanism.  Fresh blade is used to make a medial parapatellar arthrotomy.  Soft tissue on the proximal medial tibia is subperiosteally elevated to the joint line with the knife into the semimembranosus bursa with a Cobb elevator.  Soft tissue laterally is elevated with attention being paid to avoiding the patellar tendon on tibial tubercle.  Patella was everted, knee flexed 90 degrees and ACL and PCL removed.  Drill was used to create a starting hole in the distal femur and the canal was thoroughly irrigated.  The 5 degree left valgus alignment  guide is placed.  Distal femoral cutting block is pinned to remove 11 mm off the distal femur.  Distal femur resection is made with an oscillating saw.  Tibia subluxed forward and menisci removed.  Extramedullary tibial alignment guide was placed referencing proximally at the medial aspect of the tibial tubercle and distally along the second metatarsal axis and tibial crest.  The block is pinned to remove 2 mm off the more deficient medial side.  There is a large medial defect so it was cut little lower than normal to get to the base of the defect.  Size 4 is most appropriate tibia component and proximal tibia was prepared with a modular drill and keel punch for the size 4.  Femoral sizing guide was placed, size 5 is most appropriate femur. Rotations marked at the epicondylar axis and the size 5 cutting block is placed.  The anterior-posterior chamfer cuts were made.  Intercondylar block is placed and neck cuts made.  Trial size 5 posterior stabilized femur was placed.  With 12.5 mm posterior stabilized rotating platform insert trial was placed.  A little __________ which allowed for full extension with excellent varus and valgus and anterior-posterior balance throughout full  range of motion.  Patella was everted and thickness measured to be 25 mm.  Freehand resection taken to about 15 mm, 38 template is placed, lug holes were drilled, trial patella was placed and it tracks normally.  Osteophytes removed off the posterior femur with the trial in place.  All trials were removed and cut bone surfaces were prepared with pulsatile lavage.  Cement was mixed, and once ready for implantation, size 4 mm bearing tibial tray size 5 posterior stabilized femur and 38 patella are cemented in place.  Patella was held with a clamp.  Trial of 15-mm inserts placed beyond full extension, all extruded cement removed.  The cement was fully hardened, then the permanent 15-mm posterior stabilizer rotating  platform insert was placed in the tibial tray.  It was copiously irrigated with saline solution, the arthrotomy closed over Hemovac drain with interrupted #1 PDS. Flexed against gravity to 135 degrees and the patella tracks normally. The tourniquet released, total time of 38 minutes.  Subcu is then closed with interrupted 2-0 Vicryl, subcuticular running 4-0 Monocryl. Incisions cleaned and dried.  Catheter for Marcaine pain pump was placed and pumps initiated.  Steri-Strips and bulky sterile dressing were applied, and he is placed into a knee immobilizer, awakened, and transported to Recovery in stable condition.     Ollen Gross, M.D.     FA/MEDQ  D:  04/03/2011  T:  04/04/2011  Job:  [id number]  Electronically Signed by Ollen Gross M.D. on 04/05/2011 10:18:05 AM

## 2011-04-06 LAB — BASIC METABOLIC PANEL
BUN: 7 mg/dL (ref 6–23)
Chloride: 99 mEq/L (ref 96–112)
GFR calc Af Amer: >60 mL/min (ref >60)
Potassium: 4.1 mEq/L (ref 3.5–5.1)

## 2011-04-06 LAB — CBC
MCV: 88.5 fL (ref 78.0–100.0)
Platelets: 176 10*3/uL (ref 150–400)
RBC: 3.73 MIL/uL — ABNORMAL LOW (ref 4.22–5.81)
WBC: 8.1 10*3/uL (ref 4.0–10.5)

## 2011-04-10 NOTE — H&P (Signed)
NAMEAMBROSIO, [name] [name]                 ACCOUNT NO.:  [id number]  MEDICAL RECORD NO.:  [id number]           PATIENT TYPE:  I  LOCATION:  0012                         FACILITY:  [facility]  PHYSICIAN:  Ollen Gross, M.D.    DATE OF BIRTH:  1941-06-04  DATE OF ADMISSION:  04/03/2011 DATE OF DISCHARGE:                             HISTORY & PHYSICAL   CHIEF COMPLAINT:  Left knee pain.  HISTORY OF PRESENT ILLNESS:  The patient is a 70 year old male who has been seen by Dr. Lequita Halt for evaluation of the knees.  He has had trouble with both knees for quite some time now.  He is very active.  He has been seen in the office and found to have advanced end-stage arthritis of both knees, the left is a little worse radiographically. There is bone-on-bone both in the medial and patellofemoral compartments.  He has advanced arthritis.  He has had injections in the past, but wants to do something more permanent.  It is felt that he would benefit from undergoing surgical intervention.  The patient was subsequently admitted to the hospital.  ALLERGIES:  NO KNOWN DRUG ALLERGIES.  INTOLERANCES:  MORPHINE causes sickness.  CURRENT MEDICATIONS: 1. Pantoprazole 40 mg daily. 2. Crestor 40 mg daily. 3. Aspirin 325 mg daily. 4. Oxycodone as needed. 5. Hydrocodone as needed. 6. Nitroglycerin 0.4 mg as needed. 7. Fish oil omega 3 daily. 8. Vitamin D twice a day. 9. Glucosamine daily. 10.Men's multivitamin daily.  PAST MEDICAL HISTORY: 1. Coronary arterial disease. 2. Coronary artery bypass grafting of five vessels. 3. Reflux disease. 4. Hemorrhoids. 5. Arthritis. 6. Childhood illnesses of measles.  PAST SURGICAL HISTORY:  Coronary artery bypass grafting of five vessels in March 2009, right knee arthroscopy in 2009 and colonoscopy.  FAMILY HISTORY:  Father deceased at 13 with a massive MI.  Also a history of pneumonia.  Mother living at age 42.  She has had five-vessel bypass.  SOCIAL HISTORY:   Married.  Retired.  Nonsmoker.  Five children.  He does want to look into a skilled rehabilitation facility [facility].  REVIEW OF SYSTEMS:  GENERAL:  No fevers, chills or night sweats. NEUROLOGIC:  A little bit of ringing in the ears.  No seizures, syncope or paralysis.  RESPIRATORY:  The patient states he has a little shortness of breath on exertion, but no shortness of breath at rest.  No productive cough or hemoptysis.  CARDIOVASCULAR:  No chest pain, no orthopnea.  GASTROINTESTINAL:  No nausea, vomiting, diarrhea, or constipation.  GENITOURINARY:  A little bit of nocturia.  No dysuria, hematuria.  MUSCULOSKELETAL:  Knee pain.  PHYSICAL EXAMINATION:  VITAL SIGNS:  Pulse 68, respirations 12, blood pressure 122/64. GENERAL:  A 70 year old white male, well-nourished, well-developed, in no acute distress.  He is alert and oriented and cooperative.  He is accompanied by his family. HEENT:  Normocephalic, atraumatic.  Pupils are equal, round and reactive.  EOMs intact.  Full upper denture and lower partial plate. NECK:  Supple. CHEST:  Chest is clear anterior and posterior chest walls.  No rhonchi, rales or wheezing. HEART:  Regular rate and rhythm without murmur, S1-S2 noted. ABDOMEN:  Soft, slightly round.  Bowel sounds are present. RECTAL/BREASTS/GENITALIA:  Not done as not pertinent to present illness. EXTREMITIES:  Left knee moderate crepitus noted on passive range of motion.  Range of motion 10 to 120.  Varus deformity.  IMPRESSION:  Osteoarthritis of the left knee.  PLAN:  The patient was admitted to [facility] and will undergo a left total-knee replacement arthroplasty.  The surgery will be performed by Dr. Ollen Gross.  The patient does want to look into [facility] postoperatively for inpatient skilled rehabilitation.     [name], P.A.C.   ______________________________ Ollen Gross, M.D.    ALP/MEDQ  D:   04/03/2011  T:  04/03/2011  Job:  [id number]  cc:   Donzetta Sprung Fax: [phone]  Electronically Signed by Patrica Duel P.A.C. on 04/06/2011 10:09:36 AM Electronically Signed by Ollen Gross M.D. on 04/10/2011 06:47:45 PM

## 2011-04-10 NOTE — Discharge Summary (Signed)
  [name], [name] [name]                 ACCOUNT NO.:  [id number]  MEDICAL RECORD NO.:  [id number]           PATIENT TYPE:  I  LOCATION:  1612                         FACILITY:  [facility]  PHYSICIAN:  Alexzandrew L. Perkins, P.A.C.DATE OF BIRTH:  July 14, 1941  DATE OF ADMISSION:  04/03/2011 DATE OF DISCHARGE:  04/06/2011                        DISCHARGE SUMMARY - REFERRING   ADDENDUM  Medication change.  On the original discharge summary discontinue the Vicodin 5 mg.  He is actually on new medication Vicodin 7.5 mg one or two every 4 to 6 hours as needed for moderate pain.     Alexzandrew L. Perkins, P.A.C.     ALP/MEDQ  D:  04/06/2011  T:  04/06/2011  Job:  [id number]  Electronically Signed by Patrica Duel P.A.C. on 04/06/2011 10:09:41 AM Electronically Signed by Ollen Gross M.D. on 04/10/2011 06:47:49 PM

## 2011-04-10 NOTE — Discharge Summary (Signed)
[name] [name], [name] [name]                 ACCOUNT NO.:  [id number]  MEDICAL RECORD NO.:  [id number]           PATIENT TYPE:  I  LOCATION:  1612                         FACILITY:  [facility]  PHYSICIAN:  Ollen Gross, M.D.    DATE OF BIRTH:  [birth year]  DATE OF ADMISSION:  04/03/2011 DATE OF DISCHARGE:  04/06/2011                        DISCHARGE SUMMARY - REFERRING   ADMITTING DIAGNOSES: 1. Osteoarthritis, left knee. 2. Coronary arterial disease, status post coronary artery bypass     grafting of 5 vessels. 3. Reflux disease. 4. Hemorrhoids. 5. Arthritis. 6. Childhood illnesses, measles.  DISCHARGE DIAGNOSES: 1. Osteoarthritis, left knee, status post left total knee replacement     arthroplasty. 2. Mild postop hyponatremia. 3. Mild postop hypokalemia. 4. Coronary arterial disease, status post coronary artery bypass     grafting of 5 vessels. 5. Reflux disease. 6. Hemorrhoids. 7. Arthritis. 8. Childhood illnesses, measles.  PROCEDURE:  April 03, 2011, left total knee.  SURGEON:  Ollen Gross, M.D.  ASSISTANT:  Alexzandrew L. Perkins, P.A.C.  ANESTHESIA:  Anesthesia was performed under spinal anesthesia with a 38- minute tourniquet time.  CONSULTS:  None.  BRIEF HISTORY:  Mr. Kurek is a 70 year old male with end-stage arthritis of both knees, left more symptomatic than right. He has failed operative management, now presents for total knee arthroplasty.  LABORATORY DATA:  Preop CBC showed hemoglobin of 14.6, hematocrit of 43.5, white cell count 8.5, platelets 227,000.  PT/INR 13.1 and 0.97 with a PTT of 39.  Chem panel on admission all within normal limits. Preop UA was negative.  Blood group type O negative.  Nasal swabs were positive for Staph aureus but negative for MRSA.  Serial CBCs were followed.  Hemoglobin dropped down to 11.8, then 11.4.  Last H and H was 11.1 and 33.0.  Serial BMETs were followed throughout the hospital course.  The potassium did drop from 4.0 to  3.3, back up to 4.1.  Sodium dropped from 138 to 136, last noted at 133.  X-rays, 2-view chest, March 30, 2011, no significant abnormalities.  EKG, looks like the date is March 2011, normal sinus rhythm, normal EKG confirmed.  HOSPITAL COURSE:  The patient was admitted to [facility], taken to OR, underwent above-stated procedure without complication.  The patient tolerated the procedure well, later transferred to recovery room on orthopedic floor, started on p.o. and IV analgesic pain control following surgery, put on Xarelto for DVT prophylaxis.  Had a little bit of pain through the night but doing pretty well on the morning of day 1. He was started back on his home meds.  He had a little bit of low but fairly adequate urinary output.  We monitored that closely.  He did have fair amount of positive volume from the surgery, so we gave him a little bit of Lasix to help out with diuresis.  Hemoglobin looked good.  He started getting up out of bed.  We got a Child psychotherapist involved in and we wanted look into a skilled facility and was interested in [facility].  The patient did very well with this therapy on day  1 and by day 2, he was walking well.  Unfortunately, the potassium was down a little bit, so we put him on some more potassium supplements and had a little bit of intermittent nausea also on day 2, felt like it was coming from the pain pills, so we reduced the pain pill down to Vicodin and that seemed to help because on the morning of day 3 he was doing well, no more nausea.  He was walking well with this therapy and felt he would be a good candidate.  Noted bed was available at [facility] and we decided to transfer him over at that time.  DISCHARGE PLAN: 1. The patient will be transferred over to [facility]     on Apr 06, 2011. 2. Discharge diagnoses, please see above.  DISCHARGE MEDICATIONS:  Current medications at time of transfer  include: 1. Xarelto 10 mg p.o. daily for 2 weeks, then discontinue Xarelto. 2. Colace 100 mg p.o. b.i.d. 3. Flonase nasal spray, 2 sprays nasally daily. 4. Aspirin 325 mg p.o. daily. 5. Crestor 20 mg p.o. q.h.s. 6. Pantoprazole 40 mg daily. 7. Robaxin 500 mg p.o. q.6-8h. p.r.n. spasm. 8. Restoril 15 to 30 mg p.o. q.h.s. p.r.n. sleep. 9. Tylenol 325 one or two every 4 to 6 hours as needed for mild pain,     temperature or headache. 10.Vicodin 5 mg p.o. 1 or 2 every 4 to 6 hours as needed for moderate     pain. 11.Sublingual nitroglycerin 0.4 mg every 5 minutes p.r.n. chest pain.  DIET:  Heart-healthy cardiac diet.  ACTIVITY:  He is weightbearing as tolerated to the left lower extremity, total knee protocol.  Gait training, ambulation, ADLs, range of motion and strengthening exercises.  FOLLOWUP:  He is to follow up with Dr. Lequita Halt in the office in approximately 2 weeks from the date of surgery, either Tuesday May 15 or Thursday May 17.  Please contact the office at [phone] to help arrange appointment for followup care and time for this patient.  DISPOSITION:  [facility].  CONDITION ON DISCHARGE:  Improved.     [name], P.A.C.   ______________________________ Ollen Gross, M.D.    ALP/MEDQ  D:  04/06/2011  T:  04/06/2011  Job:  [id number]  cc:   Donzetta Sprung Fax: [phone]  [facility]  Electronically Signed by Patrica Duel P.A.C. on 04/06/2011 10:09:48 AM Electronically Signed by Ollen Gross M.D. on 04/10/2011 06:47:51 PM

## 2011-04-18 NOTE — Consult Note (Signed)
NAMERAYAAN, GARGUILO                 ACCOUNT NO.:  [id number]   MEDICAL RECORD NO.:  [id number]          PATIENT TYPE:  INP   LOCATION:  3733                         FACILITY:  [facility]   PHYSICIAN:  Salvatore Decent. Dorris Fetch, M.D.DATE OF BIRTH:  04-Oct-1941   DATE OF CONSULTATION:  02/08/2009  DATE OF DISCHARGE:                                 CONSULTATION   REASON FOR CONSULTATION:  Severe three-vessel coronary artery disease.   HISTORY OF PRESENT ILLNESS:  Mr. Afzal is a 70 year old gentleman with  known history of cardiac disease.  He does have a family history though  with his mother previously having coronary artery bypass grafting x5.  He has severe osteoarthritis and he is schedule to undergo bilateral  knee replacements; however, when he was being evaluated medically for  that, he complained to Dr. Donzetta Sprung of shortness of breath with  exertion.  He says it has been going on for about a year.  He usually  notices this one as carrying a 200-pound bag of horseweed up a slight  incline.  He has not had any rest or nocturnal symptoms, nor has he had  any episodes with light weights.  He denies any chest pain, pressure, or  tightness.  He had a stress test with EKG changes at 85% maximal heart  rate.  There was mild global hypokinesis with EF of 51%.  There was  reversible anterior defect with mild hypokinesis.  Today, he underwent  cardiac catheterization by Dr. Bonnee Quin, which revealed severe three-  vessel coronary artery disease with heavily calcified proximal LAD.  LV  function was essentially normal by left ventriculogram.   PAST MEDICAL HISTORY:  Significant for gastroesophageal reflux, history  of esophageal dilatation 6 months ago, erectile dysfunction, arthritis,  depression, peptic ulcer disease, and right knee arthroscopy.   MEDICATIONS:  1. Protonix 40 mg daily.  2. Aspirin 81 mg daily.  3. Flexeril 10 mg p.r.n.  4. Percocet p.r.n.  5. Tylenol.  6.  Multivitamins.   ALLERGIES:  No known drug allergies.   FAMILY HISTORY:  Significant for MI in both parents.  Mother had bypass  grafting at age 19.   SOCIAL HISTORY:  The patient lives with his wife.  He is semi-retired.  He does not use tobacco or alcohol.   REVIEW OF SYSTEMS:  No difficulty with swallowing, presently no stroke  or TIA symptoms.  No recent episodes of reflux.  No chest pain.  No  exertional shortness of breath.  No shortness of breath at rest.  No  paroxysmal nocturnal dyspnea, orthopnea, or peripheral edema.  No  history of excessive bleeding or bruising.  Arthritis, pain primarily in  the knees and mild depression.   PHYSICAL EXAMINATION:  GENERAL:  Mr. Stocks is a 70 year old gentleman in  no acute distress.  He is well developed and well nourished.  NEUROLOGIC:  He is alert and oriented x3 with no focal deficits.  HEENT:  Unremarkable.  NECK:  Supple without thyromegaly, adenopathy, or bruits.  CARDIAC:  Regular rate and rhythm.  Normal S1 and S2.  No rubs, murmurs,  or gallops.  ABDOMEN:  Soft and nontender.  EXTREMITIES:  Without clubbing, cyanosis, or edema.  He has 2+ pulses  throughout.  SKIN:  Warm and dry.  VITAL SIGNS:  His temperature is 97.8, blood pressure 124/78, pulse 55,  respirations are 20, his oxygen saturation is 93% on room air.   LABORATORY DATA:  Sodium 138, potassium 3.5, BUN/creatinine are 7 and  0.65, glucose is 102.  PT 13.5, PTT 33.  White count 6.3, hematocrit 45,  platelets 228.  Stress test and catheterization as previously noted.   EKG shows sinus bradycardia with an incomplete right bundle.   IMPRESSION:  Mr. Pinney is a 70 year old gentleman with exertional  shortness of breath.  He does not have chest discomfort with that.  He  only has the shortness of breath with very significant exertion.  At  catheterization, he has severe three-vessel coronary artery disease and  needs coronary artery bypass grafting for  revascularization.  I have  discussed in detail with the patient as well as his wife and daughter  the indications, risks, benefits, and alternatives.  He understands the  risks including, but not limited to death, stroke, MI, DVT, PE,  bleeding, possible need for transfusions, infection as well as other  organ system dysfunction including respiratory, renal, or GI  complications.  He understands, accepts these risks and agrees to  proceed.  I think he should have the surgery done as soon as possible.  It appears that the first open slot in the operating schedule is Friday  morning.  The patient will likely be discharged tomorrow morning and  return on Friday morning leave of absence.      Salvatore Decent Dorris Fetch, M.D.  Electronically Signed     SCH/MEDQ  D:  02/08/2009  T:  02/09/2009  Job:  [id number]   cc:   Learta Codding, MD,FACC  Donzetta Sprung

## 2011-04-18 NOTE — Assessment & Plan Note (Signed)
[facility]                          [facility] OFFICE NOTE   NAME:[name] [name], [name] [name]                        MRN:          [id number]  DATE:03/17/2009                            DOB:          03-21-1941    REFERRING PHYSICIAN:  Donzetta Sprung   HISTORY OF PRESENT ILLNESS:  The patient is a 70 year old male with  severe coronary artery disease who underwent coronary bypass grafting.  The patient had an uncomplicated course.  He has normal LV function.  He  actually was discharged 3 days post surgery.  He reports no chest pain,  shortness of breath, orthopnea, or PND.   He is due to start the cardiac rehab tomorrow.   MEDICATIONS:  Pantoprazole 40 mg p.o. daily, Lortab 650 mg __________  p.o. b.i.d., __________ p.o. b.i.d., vitamin D3 at 1000 mL p.o. b.i.d.,  multivitamin daily, Crestor 40 mg p.o. daily, aspirin 325 mg daily,  metoprolol ER 25 mg p.o. daily.   PHYSICAL EXAMINATION:  VITAL SIGNS:  Blood pressure is 145/82, heart  rate is 75, weight is 205 pounds.  HEENT:  Pupils __________.  NECK:  Supple.  Normal carotid upstrokes.  No carotid bruits.  LUNGS:  Clear breath sounds bilaterally.  HEART:  Regular rate and rhythm.  Normal S1 and S2.  No murmurs or  gallops.  ABDOMEN:  Soft, nontender.  No rebound or guarding.  Positive  bowel  sounds.  EXTREMITIES:  No cyanosis, clubbing, or edema.  NEURO:  The patient is alert, oriented, grossly nonfocal.   PROBLEM LIST:  1. Status post coronary artery bypass grafting.  2. Normal left ventricular function.  3. Increased left atrial dilatation.  4. Scheduled elective surgery for bilateral knee replacement secondary      to osteoarthritis.   PLAN:  1. The patient has done extremely well post surgery.  He is already      exercising, but he will formally enter cardiac rehab tomorrow.  2. Gave the patient refills for metoprolol ER and no other medication      changes are done.  I told the patient to  keep an eye on his blood      pressure but I suspect with further exercise, this      was gradually improve.  3. Crestor can be continued at the current dose.     Learta Codding, MD,FACC  Electronically Signed    GED/MedQ  DD: 03/17/2009  DT: 03/18/2009  Job #: [id number]   cc:   Donzetta Sprung

## 2011-04-18 NOTE — Assessment & Plan Note (Signed)
OFFICE VISIT   ALEKSEI, GOODLIN L  DOB:  1941-03-05                                        March 12, 2009  CHART #:  [id number]   The patient is a 70 year old gentleman, who had coronary artery bypass  grafting x5 on February 12, 2009.  His postoperative course was  uncomplicated.  He has continued to do well since he was discharged from  the hospital.  He says that he is having really no pain.  He has not  taken any pain medications since he has been home.  He is walking about  8 minutes 5 times a day.  He has not had any anginal-type symptoms or  shortness of breath.  He has been following diet and exercise  instructions.  He says that he is anxious to increase his activities.  He will start cardiac rehab next week.   PHYSICAL EXAMINATION:  GENERAL:  The patient is a well-appearing, 10-  year-old gentleman, in no acute distress.  VITAL SIGNS:  Blood pressure is 133/81, pulse 74, respirations 18, and  his oxygen saturation is 97% on room air.  LUNGS:  Clear with equal breath sounds bilaterally.  CARDIAC:  Regular rate and rhythm.  Normal S1 and S2.  No murmurs, rubs,  or gallops.  CHEST:  Sternum is stable.  Sternal incision is clean, dry, and intact.  EXTREMITIES:  Leg incisions are healing well.  There is no peripheral  edema.   Chest x-ray shows no significant effusions or infiltrates.   IMPRESSION:  The patient is doing extremely well at this point in time.  His exercise tolerance is excellent.  He has minimal discomfort.  He is  going to start cardiac rehab next week.  He will  continue to be followed by Dr. Andee Lineman and Dr. Donzetta Sprung up in [place].  I would be happy to see him back anytime if I can be of any further  assistance with his care.   Salvatore Decent Dorris Fetch, M.D.  Electronically Signed   SCH/MEDQ  D:  03/12/2009  T:  03/13/2009  Job:  [id number]

## 2011-04-18 NOTE — Discharge Summary (Signed)
[name] [name], [name] [name]                 ACCOUNT NO.:  [id number]   MEDICAL RECORD NO.:  [id number]          PATIENT TYPE:  INP   LOCATION:  2004                         FACILITY:  [facility]   PHYSICIAN:  Salvatore Decent. Dorris Fetch, M.D.DATE OF BIRTH:  1940/12/31   DATE OF ADMISSION:  02/12/2009  DATE OF DISCHARGE:                               DISCHARGE SUMMARY   PRIMARY ADMITTING DIAGNOSIS:  Severe three-vessel coronary artery  disease.   ADDITIONAL/DISCHARGE DIAGNOSES:  1. Severe three-vessel coronary artery disease.  2. History of gastroesophageal reflux.  3. Arthritis.  4. Depression.  5. History of erectile dysfunction.  6. Status post esophageal dilatation.  7. Peptic ulcer disease.   PROCEDURES PERFORMED:  1. Coronary artery bypass grafting x5 (left internal mammary artery to      the left anterior descending, right internal mammary artery to the      distal right coronary artery, saphenous vein graft to the first      diagonal, sequential saphenous vein graft to the first and second      obtuse marginals).  2. Endoscopic vein harvest, right thigh.   HISTORY:  The patient is a 70 year old male who has a history of severe  osteoarthritis and was scheduled to undergo bilateral knee replacements.  During a medical workup prior to proceeding with surgery, he reported  some shortness of breath with exertion which have been going on for  about a year.  He denied any chest pain or associated symptoms.  He  underwent a stress test which showed EKG changes with 85% maximal heart  rate.  There was mild global hypokinesis with an EF of 51%.  There was  in a reversible anterior defect with mild hypokinesis.  Because of this,  he was referred to [facility] for further workup.  He underwent  cardiac catheterization by Dr. Riley Kill on February 08, 2009.  This showed  severe three-vessel coronary artery disease with a heavily calcified  proximal LAD.  Left ventricular function was within normal  limits.  Because of his multivessel disease and his symptoms of exertional  dyspnea, it was felt that he would benefit from surgical  revascularization.  He was seen in consultation by Dr. Charlett Lango following his catheterization for consideration of CABG.  Dr. Dorris Fetch reviewed his films and agreed that he would best be  served by proceeding with CABG at this time.  Since his symptoms had  remained stable, He was allowed to return home prior to surgery and was  scheduled for outpatient admission on February 12, 2009.  All risks,  benefits, and alternatives of surgery were explained to the patient and  he agreed to proceed.   HOSPITAL COURSE:  [name] [name] was admitted to [facility] on February 12, 2009.  He was taken to the operating room where he underwent CABG x5  as described above performed by Dr. Dorris Fetch.  Please see operative  report for complete details of surgery.  He tolerated the procedure well  and was transferred to the SICU in stable condition.  He was able to be  extubated  shortly after surgery.  He was hemodynamically stable and  doing fairly well on postop day #1.  At that time, he was requiring low-  dose pressor agents.  Therefore, he was kept in the unit for further  observation.  His lines and the chest tubes were removed in the standard  fashion.  By postop day #2, he was ready for transfer to the floor.  Overall postoperative course has been uneventful.  He was weaned from  all drips prior to transfer from the unit.  Since that time, his blood  pressure and heart rate have been stable and he has been started on a  beta-blocker, which he is tolerating well.  He has had a mild volume  overloaded and was started on Lasix and is currently diuresing well.  His incisions are all healing well.  He is ambulating in the halls with  cardiac rehab phase 1 and is making good progress.  He is tolerating a  regular diet and is having normal bowel and bladder  function.   His most recent labs on postop day #3 show hemoglobin of 10.8,  hematocrit 31.2, platelets 149, white count 10.7.  Sodium 137, potassium  3.4 which has been replaced, BUN 7, creatinine 0.92.  His chest x-ray  showed small bilateral effusions and atelectasis.  He will undergo a  repeat BMET on the morning of February 16, 2009.  It is anticipated that if  he has remained stable and no acute changes have occurred, he will  hopefully be ready for discharge home.   DISCHARGE MEDICATIONS:  1. Enteric-coated aspirin 325 mg daily.  2. Toprol-XL 25 mg daily.  3. Percocet 5/325 one to two q.4 h. p.r.n. for pain.  4. Lasix 40 mg daily x1 week.  5. Potassium 20 mEq daily x1 week.  6. Crestor 40 mg daily.  7. Protonix 40 mg daily.  8. Flexeril 10 mg q.8 h. p.r.n.   DISCHARGE INSTRUCTIONS:  He was asked to refrain from driving, heavy  lifting, or strenuous activity.  He may continue ambulating daily and  using his incentive spirometer.  He may shower daily and clean his  incisions with soap and water.  He will continue low-fat, low-sodium  diet.   DISCHARGE FOLLOWUP:  He will see Dr. Andee Lineman back in the office in 2  weeks.  He will then follow up with Dr. Dorris Fetch on March 12, 2009  with a chest x-ray.  In the interim, he is asked to call our office if  he experiences any problems or have questions.      Coral Ceo, P.A.      Salvatore Decent Dorris Fetch, M.D.  Electronically Signed    GC/MEDQ  D:  02/15/2009  T:  02/16/2009  Job:  [id number]   cc:   Learta Codding, MD,FACC  Donzetta Sprung

## 2011-04-18 NOTE — Assessment & Plan Note (Signed)
[facility]                          [facility] OFFICE NOTE   NAME:[name], [name]                          MRN:          [id number]  DATE:02/03/2009                            DOB:          October 17, 1941    PRIMARY CARE PHYSICIAN:  Donzetta Sprung, MD   HISTORY OF PRESENT ILLNESS:  The patient is a very pleasant 70 year old  male with a history of osteoarthritis on both knees.  The patient is  scheduled in the near future to undergo bilateral knee replacement.  This will be probably done by Dr. Charlann Boxer in [place] at [facility].  The patient; however, when he recently saw Dr. Reuel Boom in  the office reported that he had shortness of breath on exertion, which  has been probably going on for about a year.  He states that just  carrying something around and walking across the parking lot gives him  significant dyspnea.  Symptoms appear to have gotten worse.  He denies  however any chest pain.  He has no orthopnea or PND.  Dr. Reuel Boom send  the patient for a stress test, which was markedly positive with ischemia  in distribution of the LAD.  During the stress test, the patient  developed dyspnea, but no chest pain.  The patient states that sometimes  he rides exercise bike.  He does not have too much problems doing that.   He has not been referred for consultation and possible cardiac  catheterization.   PAST MEDICAL HISTORY:  1. Reflux as well as esophageal dilatation by Dr. Karilyn Cota.  2. Sexual dysfunction.  3. Arthritis.  4. Depression.  5. History of peptic ulcer disease, treated by Dr. Karilyn Cota.  6. He had a colonoscopy in 2007 as well as to 2009 within normal      limits.  7. Surgery on the right knee in January 2009 with arthroscopy.  8. History of oral surgery.   ALLERGIES:  No known drug allergies.   SOCIAL HISTORY:  The patient used to work full-time.  He occasionally  exercises.  He does not drink or use alcohol.  He lives with his  spouse.  First wife died.  He is remarried.   FAMILY HISTORY:  Notable for father died from pneumonia and myocardial  infarction.  Mother had coronary bypass grafting x 5, brother had  cataracts and detached retina.   MEDICATIONS:  1. Pantoprazole 40 mg p.o. daily.  2. Tylenol Arthritis 650 mg 2 tablets p.o. b.i.d.  3. Aspirin 81 mg p.o. b.i.d.  4. Glucosamine 2000 mg p.o. b.i.d.  5. Vitamin D 3000 mg p.o. b.i.d.  6. Red yeast rice 1200 mg p.o. b.i.d.  7. Multivitamin.   REVIEW OF SYSTEMS:  As per HPI.  No nausea or vomiting.  No fever or  chills.  No melena or hematochezia.  No dysuria or frequency.  Remainder  of the 18 point review of systems, otherwise negative.   PHYSICAL EXAMINATION:  VITAL SIGNS:  Blood pressure is 137/84, heart  rate is 82, and weight is 205 pounds.  GENERAL:  Well-nourished white male  in no apparent distress.  HEENT:  Pupils isochoric.  Conjunctivae clear.  NECK:  Supple.  Normal carotid upstroke with no carotid bruits.  LUNGS:  Clear breath sounds bilaterally.  HEART:  Regular rate and rhythm with normal S1 and S2.  No murmur, rubs,  or gallops.  ABDOMEN:  Soft and nontender with no rebounding or guarding.  Good bowel  sounds.  EXTREMITIES:  No cyanosis, clubbing, or edema.  NEUROLOGIC:  The patient is alert and oriented.  Grossly nonfocal.   PROBLEM LIST:  1. Abnormal Cardiolite stress study with ischemia in the left anterior      descending artery distribution.  2. Dyspnea on exertion, likely angina equivalent.  3. History of esophageal dilatation, peptic ulcer disease, stable.  4. Elective surgery scheduled for bilateral knee replacement.  5. History of depression.  6. History of osteoarthritis.   PLAN:  1. The patient will be scheduled for diagnostic catheterization.  I      suspect that he has significant LAD disease.  2. I also discussed the case with Dr. Riley Kill regarding the need for      drug-eluting versus non drug-eluting stent as  the patient would      like to have his surgery done between now and a year.  The patient      however states that he wants the best stent even if he has asked      to wait a year for surgery.  He is a nondiabetic, which hopefully      will give [place] some options.  3. The patient will be scheduled in the inpatient lab as he likely      will need an intervention.  I discussed the risk and benefit of the      procedure with the patient and he is willing to proceed.  4. I also gave the patient prescription for p.r.n. nitroglycerin in      the meanwhile.  Of note, it is also that the patient is only on red      yeast rice.  His LDL is 148, his cholesterol is 208, and HDL of 43.     Learta Codding, MD,FACC    GED/MedQ  DD: 02/03/2009  DT: 02/04/2009  Job #: [id number]   cc:   Donzetta Sprung

## 2011-04-18 NOTE — Discharge Summary (Signed)
[name], [name]                 ACCOUNT NO.:  [id number]   MEDICAL RECORD NO.:  [id number]          PATIENT TYPE:  INP   LOCATION:  3737                         FACILITY:  [facility]   PHYSICIAN:  [name] Morton. Riley Kill, MD, FACCDATE OF BIRTH:  20-Jul-1941   DATE OF ADMISSION:  02/08/2009  DATE OF DISCHARGE:  02/09/2009                               DISCHARGE SUMMARY   PRIMARY CARDIOLOGIST:  Dr. Lewayne [name].   PRIMARY CARE [name] [name]:  Dr. Donzetta [name].   CARDIOTHORACIC SURGEON:  Dr. Dorris [name]   DISCHARGE DIAGNOSIS:  Unstable angina/coronary artery disease.   SECONDARY DIAGNOSES:  1. Nonobstructive carotid artery disease.  2. Gastroesophageal reflux disease.  3. Osteoarthritis.  4. Depression.  5. History of sexual dysfunction.  6. Peptic ulcer disease.  7. History of right knee surgery January 2009 with arthroscopy.  8. History of oral surgery.   ALLERGIES:  No known drug allergies.   PROCEDURE:  Left heart cardiac catheterization revealing significant  multivessel coronary artery disease.  Bilateral carotid ultrasounds  revealing a 46% right ICA stenosis.   HISTORY OF PRESENT ILLNESS:  A 70 year old Caucasian male with the above  problem list.  He recently complained to his primary care [name] [name] of  dyspnea on exertion and underwent stress Myoview which was markedly  positive with ischemia in the LAD distribution.  He was seen by Dr.  Andee [name] in the office on March 3 and decision made to pursue left heart  cardiac catheterization.   HOSPITAL COURSE:  The patient presented to the [facility] cath lab on  March 8 and underwent left heart cardiac catheterization, revealing  significant multivessel disease with normal LV function.  Cardiothoracic  surgery was consulted.  The patient seen by Dr. Dorris [name] with plans  arranged for coronary artery bypass grafting  this Friday March 12.  The  patient was initiated on aspirin and statin therapy.  Beta blocker was  not started secondary  to baseline bradycardia with rates in the low 50s.  The patient will take leave of absence from [facility] today to return  on Friday March 12 for scheduled bypass grafting.   DISCHARGE LABS:  Hemoglobin 14.1, hematocrit 40.4, WBC 7.5, platelets  206.  INR 1.0.  Sodium 140, potassium 3.7, chloride 105, CO2 29, BUN 9,  creatinine 0.91, glucose 105.  Total bilirubin 0.9, alkaline phosphatase  52, AST 14, ALT 17, total protein 5.9, albumin 2.4, calcium 9.1.   DISPOSITION:  The patient will be discharged home today in good  condition.   FOLLOW-UP PLANS AND APPOINTMENTS:  As above.  The patient will return to  [facility] on Friday March 12 for coronary artery bypass grafting.  We  have arranged for him to follow up with Dr. Andee [name] following hospital  bypass surgery on April 14 at 2:15 p.m..   DISCHARGE MEDICATIONS:  1. Aspirin 325 mg daily.  2. Crestor 40 mg daily.  3. Protonix 40 mg daily.  4. Nitroglycerin 0.4 mg p.r.n. chest pain.   Patient was previously taking hydrocodone, Percocet, Flexeril, Tylenol  Arthritis and we recommend that he continue to take  these as needed as  previously prescribed.   Outstanding lab studies:  None.   Duration discharge encounter 40 minutes including physician time.      [name] [name], ANP      [name] Morton. Riley Kill, MD, [facility]  Electronically Signed    CB/MEDQ  D:  02/09/2009  T:  02/09/2009  Job:  [id number]   cc:   [name] [name]

## 2011-04-18 NOTE — Op Note (Signed)
NAMETEJ, MURDAUGH                 ACCOUNT NO.:  [id number]   MEDICAL RECORD NO.:  [id number]          PATIENT TYPE:  INP   LOCATION:  2311                         FACILITY:  [facility]   PHYSICIAN:  Salvatore Decent. Dorris Fetch, M.D.DATE OF BIRTH:  [birth year]   DATE OF PROCEDURE:  02/12/2009  DATE OF DISCHARGE:                               OPERATIVE REPORT   PREOPERATIVE DIAGNOSIS:  Severe 3-vessel coronary artery disease with  exertional angina.   POSTOPERATIVE DIAGNOSIS:  Severe 3-vessel coronary artery disease with  exertional angina.   PROCEDURE:  Median sternotomy, extracorporeal circulation, coronary  artery bypass grafting x5 (left internal mammary artery to left anterior  descending, right internal mammary artery to distal right coronary  artery, saphenous vein graft to first diagonal, and sequential saphenous  vein graft to obtuse marginal 1 and 2), endoscopic vein harvest, right  thigh.   SURGEON:  Salvatore Decent. Dorris Fetch, MD   ASSISTANT:  Rowe Clack, PA-C   ANESTHESIA:  General.   FINDINGS:  Good-quality targets, good-quality conduits.   CLINICAL NOTE:  Mr. Maultsby is a 70 year old gentleman who has been  experiencing chest discomfort with heavy exertion over the past year.  A  stress test showed reversible anterior defect.  He underwent cardiac  catheterization which revealed severe 3-vessel coronary artery disease  with heavily calcified proximal LAD.  The patient was advised to undergo  coronary artery bypass grafting.  Indications, risks, benefits, and  alternatives were discussed in detail with the patient.  He understood  and accepted risks and agreed to proceed.   OPERATIVE NOTE:  Mr. Hammitt was brought to the preop holding area on  February 12, 2009.  There, the Anesthesia service under direction of Dr.  Laverle Hobby placed a Swan-Ganz catheter and an arterial blood pressure  monitoring catheter and intravenous access.  Intravenous antibiotics  were administered.   The patient was taken to the operating room,  anesthetized, and intubated.  A Foley catheter was placed.  The chest,  abdomen, and legs were prepped and draped in the usual fashion.  An  incision was made in the medial aspect of the right leg and the greater  saphenous vein was harvested from the right thigh, it was of good  quality.  A 2000 units of heparin was administered during vessel  harvest.  Simultaneously, a median sternotomy was performed.  The left  internal mammary artery was harvested using standard technique.  It was  a good quality vessel with excellent flow.   Next, the right internal mammary artery was harvested again using  standard technique.  It also was a good-quality vessel with excellent  flow and good length.  The remainder of full heparin dose was given.   The pericardium was opened.  The ascending aorta was inspected.  There  was no atherosclerotic disease.  The aorta was cannulated via concentric  2-0 Ethibond pledgeted pursestring sutures.  A dual stage venous cannula  was placed via pursestring suture in the right atrial appendage.  Cardiopulmonary bypass was instituted and the patient was cooled to 32  degrees Celsius.  Coronary  arteries were inspected and anastomotic sites  were chosen.  The conduits were inspected and cut to length.  A foam pad  was placed in the pericardium to protect left phrenic nerve and insulate  the heart.  A temperature probe was placed in myocardial septum, and a  cardioplegic cannula was placed in the ascending aorta.   The aorta was cross-clamped.  The left ventricle was emptied via aortic  root vent.  Cardiac arrest was achieved with combination of cold  antegrade blood cardioplegia and topical iced saline.  After achieving  complete diastolic arrest and adequate myocardial septal cooling to 10  degrees Celsius, following distal anastomoses were performed.   First, a reverse saphenous vein graft was placed sequentially to  obtuse  marginals 1 and 2.  These were both 1.5-mm good-quality targets.  A side-  to-side anastomosis was performed to OM-1 and an end-to-side to OM-2,  both with running 7-0 Prolene sutures.  All anastomoses were probed  proximally and distally at the completion to ensure patency.  Cardioplegia was administered down the graft.  There was good flow and  good hemostasis.   Next, a reverse saphenous vein graft was placed end-to-side to the first  diagonal branch of the LAD.  This was a bifurcating vessel that had a  more superolateral branch and more inferior branch.  The more inferior  branch was diffusely diseased, not graftable.  More superior branch was  graftable but 1-mm, fair-quality target.  The vein graft was anastomosed  end-to-side with a running 7-0 Prolene suture.  There was good flow and  good hemostasis.   Next, the right internal mammary artery was brought through a window in  the pericardium.  The distal limb was beveled and was anastomosed end-to-  side to the distal right coronary artery.  The distal right coronary  artery was 2-mm, good-quality target.  The right mammary was a 1.5-mm,  good-quality conduit.  The anastomosis was performed with a running 8-0  Prolene suture.  At the completion of the anastomosis, the bulldog clamp  was briefly removed to inspect for hemostasis and was replaced.  The  mammary pedicle was tacked to the epicardial surface of the heart with 6-  0 Prolene sutures.  It should be noted that the mammary had good length  and reached easily through this area of the heart without any tension.   Next, the left internal mammary artery was brought through a window in  the pericardium.  The distal limb was beveled and was anastomosed end-to-  side to the distal LAD.  The LAD was intramyocardial in its midportion  but was a 1.5-mm, good-quality target distally where it was grafted.  The left mammary artery was 2-mm, good-quality conduit.  The  anastomosis  was performed with a running 8-0 Prolene suture.  At the completion of  the mammary to LAD anastomosis, bulldog clamps were briefly removed to  inspect for hemostasis.  Immediate and rapid septal rewarming was noted.  The bulldog clamp was replaced.  The mammary pedicle was tacked to the  epicardial surface of the heart with 6-0 Prolene sutures and additional  cardioplegia was administered.   The vein grafts were cut to length.  The cardioplegic cannula was  removed from the ascending aorta and the proximal vein graft anastomoses  were performed to 4.5-mm punch aortotomies with running 6-0 Prolene  sutures while under cross-clamp.  At the completion of the final  proximal anastomoses, the patient was placed in Trendelenburg  position.  Lidocaine was administered.  Bulldog clamps were removed from the  mammary arteries.  The aortic root was de-aired, and the aortic cross-  clamp was removed.  The total crossclamp time was 76 minutes.   While the patient was being rewarmed, all proximal and distal  anastomoses were inspected for hemostasis.  He required a single  defibrillation of 20 joules and remained in sinus rhythm thereafter.  Epicardial pacing wires were placed on the right ventricle and right  atrium.  When the patient was rewarmed to a core temperature of 37  degrees Celsius, he was weaned from cardiopulmonary bypass on the first  attempt without inotropic support.  Total bypass time was 108 minutes.   A test dose of protamine was administered and was well tolerated.  The  atrial and aortic cannulae were removed.  The remainder of the protamine  was administered without incident.  The chest was irrigated with 1 liter  of warm normal saline.  Hemostasis was achieved.  The pericardium was  reapproximated with interrupted 3-0 silk sutures and came together  easily without tension.  The sternum was closed with interrupted heavy  gauge stainless steel  wires.  Pectoralis  fascia, subcutaneous tissue, and skin were closed in  standard fashion.  All sponge, needle, and instrument counts were  correct at the end of the procedure.  The patient was transported from  the operating room to the Surgical Intensive Care Unit in fair  condition.       Salvatore Decent Dorris Fetch, M.D.  Electronically Signed     SCH/MEDQ  D:  02/12/2009  T:  02/13/2009  Job:  [id number]   cc:   Learta Codding, MD,FACC  Donzetta Sprung

## 2011-04-18 NOTE — Cardiovascular Report (Signed)
[name] [name], [name] [name]                 ACCOUNT NO.:  [id number]   MEDICAL RECORD NO.:  [id number]          PATIENT TYPE:  INP   LOCATION:  3737                         FACILITY:  [facility]   PHYSICIAN:  Arturo Morton. Riley Kill, MD, FACCDATE OF BIRTH:  02-21-1941   DATE OF PROCEDURE:  02/08/2009  DATE OF DISCHARGE:                            CARDIAC CATHETERIZATION   INDICATIONS:  Mr. Schroeter is a 70 year old who presents with some mild  exertional symptoms, and an abnormal radionuclide imaging study.  This  was done due to prior planned orthopedic surgery.  He was seen by Dr.  Andee Lineman and referred for treatment.  He has an abnormal myocardial  perfusion imaging study.   PROCEDURES:  1. Left heart catheterization.  2. Selective coronary arteriography.  3. Selective left ventriculography.   DESCRIPTION OF PROCEDURE:  The patient was brought to the  catheterization laboratory and prepped and draped in usual fashion.  Through an anterior puncture, the right femoral artery was easily  entered and a 5-French sheath was placed.  Using left and right coronary  arteries were then obtained in multiple angiographic projections.  Central aortic and left ventricular pressures were measured with  pigtail.  Ventriculography was then performed in the RAO projection.  He  tolerated the procedure well and was taken to the holding area for  sheath removal.  We spoke with the patient and subsequently his family  and a surgical consultation was obtained.  There were no complications.   HEMODYNAMIC DATA:  1. Central aortic pressure 147/71, mean 101.  2. Left ventricular pressure 148/90.  3. No gradient or pullback across the aortic valve.   ANGIOGRAPHIC DATA:  1. On plain fluoroscopy, there was heavy calcification predominantly      in the LAD.  2. Ventriculography in the RAO projection reveals preserved global      systolic function and overall systolic function would be in the      range of about 55%.  3. Left  main is free of critical disease.  4. The LAD is diffusely calcified as noted.  There is multiple lesions      with diffuse 40-50% proximal disease and a 50% lesion and then      probably a 70% lesion before the diagonal.  Just after the      diagonal, there was an additional 70% lesion.  The distal LAD      appears to be suitable for grafting.  The first diagonal is smaller      in caliber, has 90% narrowing and then divides.  A subbranch has      90% narrowing as well.  5. The ramus intermedius is small with 90% mid-narrowing.  6. The circumflex provides a first marginal with diffuse long 80%      disease.  The second marginal also has an 80% focal eccentric      plaque.  Both the two large marginals were suitable for grafting.  7. The right coronary artery is moderate size vessel with about 50-60%      mid-narrowing and a 90% focal stenosis in the mid  distal portion of      the midvessel.  The distal vessel appears to be suitable for      grafting.   CONCLUSIONS:  1. Preserved overall LV function.  2. Advance three-vessel coronary artery disease with diffuse disease      of the LAD and heavily calcified disease.   PLAN:  We will get a surgical consult.  The patient has advanced disease  with significant ischemia.      Arturo Morton. Riley Kill, MD, [facility]  Electronically Signed     TDS/MEDQ  D:  02/08/2009  T:  02/09/2009  Job:  [id number]   cc:   CV Laboratory  Noah Charon, MD,FACC

## 2011-04-25 NOTE — Telephone Encounter (Signed)
Done per dictation

## 2011-05-12 ENCOUNTER — Encounter: Payer: Self-pay | Admitting: Cardiology

## 2011-05-18 ENCOUNTER — Ambulatory Visit (INDEPENDENT_AMBULATORY_CARE_PROVIDER_SITE_OTHER): Payer: BC Managed Care – PPO | Admitting: Physician Assistant

## 2011-05-18 ENCOUNTER — Encounter: Payer: Self-pay | Admitting: Cardiology

## 2011-05-18 VITALS — BP 139/80 | HR 67 | Ht 70.0 in | Wt 203.0 lb

## 2011-05-18 DIAGNOSIS — I495 Sick sinus syndrome: Secondary | ICD-10-CM

## 2011-05-18 DIAGNOSIS — I25810 Atherosclerosis of coronary artery bypass graft(s) without angina pectoris: Secondary | ICD-10-CM

## 2011-05-18 MED ORDER — NITROGLYCERIN 0.4 MG SL SUBL: 0.4000 mg | SUBLINGUAL_TABLET | SUBLINGUAL | Status: DC | PRN

## 2011-05-18 NOTE — Assessment & Plan Note (Signed)
Stable HR off beta blocker.

## 2011-05-18 NOTE — Patient Instructions (Signed)
Continue all current medications. Your physician wants you to follow up in:  1 year.  You will receive a reminder letter in the mail one-two months in advance.  If you don't receive a letter, please call our office to schedule the follow up appointment   

## 2011-05-18 NOTE — Assessment & Plan Note (Addendum)
Stable on current medication regimen. Return visit with Dr Andee Lineman in 1 year, sooner if needed. Will renew prescription for p.r.n.NTG.

## 2011-05-18 NOTE — Progress Notes (Signed)
HPI: patient presents for scheduled 6 month followup.  Since his last visit with Dr. Andee Lineman, November 2011, he has undergone successful left TKR, this past April, at [facility]. He continues to participate in physical therapy, as well as cardiac rehabilitation, and reports no exertional CP or significant DOE.  Of note, he denies any history of CP, even prior to undergoing CABG. His presenting symptom was that of significant DOE with moderate exertion.  Allergies  Allergen Reactions  . Morphine     REACTION: dizziness    Current Outpatient Prescriptions on File Prior to Visit  Medication Sig Dispense Refill  . aspirin EC 325 MG tablet Take 325 mg by mouth daily.        . Cholecalciferol (VITAMIN D3) 1000 UNITS CAPS Take 1 tablet by mouth 2 (two) times daily.       . Multiple Vitamin (MULTIVITAMIN) tablet Take 1 tablet by mouth daily.        . pantoprazole (PROTONIX) 40 MG tablet Take 40 mg by mouth daily.        . rosuvastatin (CRESTOR) 40 MG tablet Take 20 mg by mouth daily.        Marland Kitchen DISCONTD: acetaminophen (TYLENOL) 650 MG CR tablet Take 1,300 mg by mouth 2 (two) times daily.        Marland Kitchen DISCONTD: Glucosamine 500 MG CAPS Take 1 capsule by mouth 2 (two) times daily.        Marland Kitchen DISCONTD: Omega-3 Fatty Acids (FISH OIL) 1000 MG CAPS Take 1 capsule by mouth daily.          Past Medical History  Diagnosis Date  . CAD (coronary artery disease) of artery bypass graft     1.Status post coronary artery bypass grafting. 2. Normal left ventricular function. 3. Increase left atrial dilatation.  . OA (osteoarthritis) of knee     Past Surgical History  Procedure Date  . Coronary artery bypass graft   . Knee arthroscopy     History   Social History  . Marital Status: Married    Spouse Name: N/A    Number of Children: N/A  . Years of Education: N/A   Occupational History  . Not on file.   Social History Main Topics  . Smoking status: Never Smoker   . Smokeless tobacco: Never Used  . Alcohol  Use: No  . Drug Use: No  . Sexually Active: Not on file   Other Topics Concern  . Not on file   Social History Narrative  . No narrative on file    Family History  Problem Relation Age of Onset  . Coronary artery disease      ROS: Negative for exertional chest pain, DOE, orthopnea, PND, lower extremity edema, palpitations, presyncope/syncope, claudication, reflux, hematuria, hematochezia, or melena. Remaining systems reviewed, and are negative.   PHYSICAL EXAM:  BP 139/80  Pulse 67  Ht 5\' 10"  (1.778 m)  Wt 203 lb (92.08 kg)  BMI 29.13 kg/m2  GENERAL: well-nourished, well-developed; NAD HEENT: NCAT, PERRLA, EOMI NECK: palpable bilateral carotid pulses, no bruits; no JVD LUNGS: CTA bilaterally HEART: RRR (S1, S2); no murmurs, rubs, or gallops ABDOMEN: soft, non-tender; intact BS EXTREMETIES: no significant peripheral edema SKIN: warm/dry; no obvious rash/lesions MUSCULOSKELETAL: no gross abnormality NEURO: A/O (x3); NL affect   EKG:    ASSESSMENT & PLAN:

## 2011-05-18 NOTE — Assessment & Plan Note (Signed)
Well controlled on Crestor, with LDL 71, February '12. Recent down titration of Crestor to 20 mg daily, per Dr Reuel Boom. Target LDL 70 or less, if feasible.

## 2011-11-20 ENCOUNTER — Telehealth: Payer: Self-pay | Admitting: Cardiology

## 2011-11-20 NOTE — Telephone Encounter (Signed)
Called patient to see if billing issue has been resolved. Left message

## 2012-01-24 ENCOUNTER — Other Ambulatory Visit: Payer: Self-pay

## 2012-01-31 DIAGNOSIS — E782 Mixed hyperlipidemia: Secondary | ICD-10-CM | POA: Diagnosis not present

## 2012-01-31 DIAGNOSIS — IMO0001 Reserved for inherently not codable concepts without codable children: Secondary | ICD-10-CM | POA: Diagnosis not present

## 2012-01-31 DIAGNOSIS — J309 Allergic rhinitis, unspecified: Secondary | ICD-10-CM | POA: Diagnosis not present

## 2012-01-31 DIAGNOSIS — M1990 Unspecified osteoarthritis, unspecified site: Secondary | ICD-10-CM | POA: Diagnosis not present

## 2012-01-31 DIAGNOSIS — K429 Umbilical hernia without obstruction or gangrene: Secondary | ICD-10-CM | POA: Diagnosis not present

## 2012-03-21 DIAGNOSIS — M1710 Unilateral primary osteoarthritis, unspecified knee: Secondary | ICD-10-CM | POA: Diagnosis not present

## 2012-04-01 DIAGNOSIS — J019 Acute sinusitis, unspecified: Secondary | ICD-10-CM | POA: Diagnosis not present

## 2012-04-01 DIAGNOSIS — J309 Allergic rhinitis, unspecified: Secondary | ICD-10-CM | POA: Diagnosis not present

## 2012-05-27 ENCOUNTER — Encounter: Payer: Self-pay | Admitting: Cardiology

## 2012-05-27 ENCOUNTER — Ambulatory Visit (INDEPENDENT_AMBULATORY_CARE_PROVIDER_SITE_OTHER): Payer: BC Managed Care – PPO | Admitting: Cardiology

## 2012-05-27 VITALS — BP 125/77 | HR 59 | Ht 72.0 in | Wt 209.1 lb

## 2012-05-27 DIAGNOSIS — I495 Sick sinus syndrome: Secondary | ICD-10-CM | POA: Diagnosis not present

## 2012-05-27 DIAGNOSIS — E785 Hyperlipidemia, unspecified: Secondary | ICD-10-CM

## 2012-05-27 DIAGNOSIS — I498 Other specified cardiac arrhythmias: Secondary | ICD-10-CM | POA: Diagnosis not present

## 2012-05-27 DIAGNOSIS — Z951 Presence of aortocoronary bypass graft: Secondary | ICD-10-CM | POA: Diagnosis not present

## 2012-05-27 DIAGNOSIS — R001 Bradycardia, unspecified: Secondary | ICD-10-CM

## 2012-05-27 NOTE — Patient Instructions (Addendum)
Continue all current medications. Your physician wants you to follow up in:  1 year.  You will receive a reminder letter in the mail one-two months in advance.  If you don't receive a letter, please call our office to schedule the follow up appointment   

## 2012-06-01 NOTE — Assessment & Plan Note (Signed)
Number current sinus bradycardia.  No symptoms.  EKG was reviewed

## 2012-06-01 NOTE — Assessment & Plan Note (Signed)
No recurrent chest pain.  Doing well post coronary bypass grafting.

## 2012-06-01 NOTE — Progress Notes (Signed)
[name] Bottoms, MD, [facility] ABIM Board Certified in Adult Cardiovascular Medicine,Internal Medicine and Critical Care Medicine    CC:    followup patient with coronary artery disease. Status post coronary bypass grafting                                                                        HPI:    Patient is doing well.  He reports no chest pain shortness of breath orthopnea and PND.  The patient is very active. The patient was able to build  A garage with no difficulty.  He denies any of the patient's presyncope or syncope. The patient has a prior history of coronary bypass grafting as well as osteo-arthritis of the knee with right total knee replacement left side.  His medical problems are stable.  PMH: reviewed and listed in Problem List in Electronic Records (and see below) Past Medical History  Diagnosis Date  . CAD (coronary artery disease) of artery bypass graft     1.Status post coronary artery bypass grafting. 2. Normal left ventricular function. 3. Increase left atrial dilatation.  . OA (osteoarthritis) of knee    Past Surgical History  Procedure Date  . Coronary artery bypass graft   . Knee arthroscopy     Allergies/SH/FHX : available in Electronic Records for review  Allergies  Allergen Reactions  . Morphine     REACTION: dizziness   History   Social History  . Marital Status: Married    Spouse Name: N/A    Number of Children: N/A  . Years of Education: N/A   Occupational History  . Not on file.   Social History Main Topics  . Smoking status: Never Smoker   . Smokeless tobacco: Never Used  . Alcohol Use: No  . Drug Use: No  . Sexually Active: Not on file   Other Topics Concern  . Not on file   Social History Narrative  . No narrative on file   Family History  Problem Relation Age of Onset  . Coronary artery disease      Medications: Current Outpatient Prescriptions  Medication Sig Dispense Refill  . aspirin EC 325 MG tablet Take 325 mg by  mouth daily.        . Cholecalciferol (VITAMIN D3) 1000 UNITS CAPS Take 1 tablet by mouth 2 (two) times daily.       . fluticasone (FLONASE) 50 MCG/ACT nasal spray Place 2 sprays into the nose as needed.       . Glucosamine HCl 1000 MG TABS Take 2 tablets by mouth daily.        Marland Kitchen HYDROcodone-acetaminophen (VICODIN) 5-500 MG per tablet Take 1 tablet by mouth every 6 (six) hours as needed.        . Multiple Vitamin (MULTIVITAMIN) tablet Take 1 tablet by mouth daily.        . nitroGLYCERIN (NITROSTAT) 0.4 MG SL tablet Place 1 tablet (0.4 mg total) under the tongue every 5 (five) minutes as needed.  25 tablet  3  . Omega-3 Fatty Acids (FISH OIL) 1200 MG CAPS Take 1 capsule by mouth daily.        Marland Kitchen oxyCODONE-acetaminophen (PERCOCET) 5-325 MG per tablet Take 1 tablet by  mouth every 4 (four) hours as needed.        . pantoprazole (PROTONIX) 40 MG tablet Take 40 mg by mouth daily.        . rosuvastatin (CRESTOR) 40 MG tablet Take 20 mg by mouth daily.          ROS: No nausea or vomiting. No fever or chills.No melena or hematochezia.No bleeding.No claudication  Physical Exam: BP 125/77  Pulse 59  Ht 6' (1.829 m)  Wt 209 lb 1.9 oz (94.856 kg)  BMI 28.36 kg/m2 General:well-nourished man in no distress. Neck:normal chronotropic no carotid bruits.  No thyromegaly,non-nodular thyroid, JVP is 6-7 cm Lungs:clear breath sounds bilaterally without wheezing Cardiac:regular rate and rhythm with normal S1 and S2 no murmurs rubs or gallops Vascular:no edema.  Normal distal pulses.  Nonpitting edema secondary to total knee replacement on the left side Skin:warm and dry Physcologic:normal affect  12lead ZOX:WRUEAV sinus rhythm with right bundle branch block heart rate 60 bpm nonspecific ST-T wave changes. Limited bedside ECHO:N/A No images are attached to the encounter.   I reviewed and summarized the old records. I reviewed ECG and prior blood work.  Assessment and Plan  CORONARY ARTERY BYPASS GRAFT,  HX OF No recurrent chest pain.  Doing well post coronary bypass grafting.  DYSLIPIDEMIA For the patient's primary care physician.total cholesterol was recently 137 mg percent and HDL cholesterol 60 mg percent and LDL cholesterol 64 all levels at goal.  SINUS BRADYCARDIA Number current sinus bradycardia.  No symptoms.  EKG was reviewed    Patient Active Problem List  Diagnosis  . DYSLIPIDEMIA  . CAD, ARTERY BYPASS GRAFT  . SINUS BRADYCARDIA  . DEGENERATIVE JOINT DISEASE, KNEE  . CORONARY ARTERY BYPASS GRAFT, HX OF

## 2012-06-01 NOTE — Assessment & Plan Note (Addendum)
For the patient's primary care physician.total cholesterol was recently 137 mg percent and HDL cholesterol 60 mg percent and LDL cholesterol 64 all levels at goal.

## 2012-08-08 DIAGNOSIS — Z0000 Encounter for general adult medical examination without abnormal findings: Secondary | ICD-10-CM | POA: Diagnosis not present

## 2012-08-08 DIAGNOSIS — M1990 Unspecified osteoarthritis, unspecified site: Secondary | ICD-10-CM | POA: Diagnosis not present

## 2012-08-08 DIAGNOSIS — I10 Essential (primary) hypertension: Secondary | ICD-10-CM | POA: Diagnosis not present

## 2012-08-08 DIAGNOSIS — E782 Mixed hyperlipidemia: Secondary | ICD-10-CM | POA: Diagnosis not present

## 2013-01-22 DIAGNOSIS — E782 Mixed hyperlipidemia: Secondary | ICD-10-CM | POA: Diagnosis not present

## 2013-01-28 DIAGNOSIS — M1990 Unspecified osteoarthritis, unspecified site: Secondary | ICD-10-CM | POA: Diagnosis not present

## 2013-01-28 DIAGNOSIS — J309 Allergic rhinitis, unspecified: Secondary | ICD-10-CM | POA: Diagnosis not present

## 2013-01-28 DIAGNOSIS — E782 Mixed hyperlipidemia: Secondary | ICD-10-CM | POA: Diagnosis not present

## 2013-07-15 DIAGNOSIS — E782 Mixed hyperlipidemia: Secondary | ICD-10-CM | POA: Diagnosis not present

## 2013-07-15 DIAGNOSIS — Z0100 Encounter for examination of eyes and vision without abnormal findings: Secondary | ICD-10-CM | POA: Diagnosis not present

## 2013-07-15 DIAGNOSIS — I10 Essential (primary) hypertension: Secondary | ICD-10-CM | POA: Diagnosis not present

## 2013-07-15 DIAGNOSIS — H52229 Regular astigmatism, unspecified eye: Secondary | ICD-10-CM | POA: Diagnosis not present

## 2013-07-15 DIAGNOSIS — H524 Presbyopia: Secondary | ICD-10-CM | POA: Diagnosis not present

## 2013-07-15 DIAGNOSIS — H2510 Age-related nuclear cataract, unspecified eye: Secondary | ICD-10-CM | POA: Diagnosis not present

## 2013-07-21 DIAGNOSIS — I10 Essential (primary) hypertension: Secondary | ICD-10-CM | POA: Diagnosis not present

## 2013-07-21 DIAGNOSIS — Z0000 Encounter for general adult medical examination without abnormal findings: Secondary | ICD-10-CM | POA: Diagnosis not present

## 2013-07-21 DIAGNOSIS — J309 Allergic rhinitis, unspecified: Secondary | ICD-10-CM | POA: Diagnosis not present

## 2013-07-21 DIAGNOSIS — M1990 Unspecified osteoarthritis, unspecified site: Secondary | ICD-10-CM | POA: Diagnosis not present

## 2013-07-21 DIAGNOSIS — IMO0001 Reserved for inherently not codable concepts without codable children: Secondary | ICD-10-CM | POA: Diagnosis not present

## 2013-07-21 DIAGNOSIS — E782 Mixed hyperlipidemia: Secondary | ICD-10-CM | POA: Diagnosis not present

## 2013-09-30 ENCOUNTER — Encounter: Payer: Self-pay | Admitting: Cardiology

## 2013-12-17 DIAGNOSIS — E782 Mixed hyperlipidemia: Secondary | ICD-10-CM | POA: Diagnosis not present

## 2013-12-17 DIAGNOSIS — IMO0001 Reserved for inherently not codable concepts without codable children: Secondary | ICD-10-CM | POA: Diagnosis not present

## 2013-12-17 DIAGNOSIS — K2100 Gastro-esophageal reflux disease with esophagitis, without bleeding: Secondary | ICD-10-CM | POA: Diagnosis not present

## 2013-12-17 DIAGNOSIS — I10 Essential (primary) hypertension: Secondary | ICD-10-CM | POA: Diagnosis not present

## 2013-12-24 DIAGNOSIS — I10 Essential (primary) hypertension: Secondary | ICD-10-CM | POA: Diagnosis not present

## 2013-12-24 DIAGNOSIS — Z1331 Encounter for screening for depression: Secondary | ICD-10-CM | POA: Diagnosis not present

## 2013-12-24 DIAGNOSIS — K2100 Gastro-esophageal reflux disease with esophagitis, without bleeding: Secondary | ICD-10-CM | POA: Diagnosis not present

## 2013-12-24 DIAGNOSIS — IMO0001 Reserved for inherently not codable concepts without codable children: Secondary | ICD-10-CM | POA: Diagnosis not present

## 2013-12-24 DIAGNOSIS — J309 Allergic rhinitis, unspecified: Secondary | ICD-10-CM | POA: Diagnosis not present

## 2013-12-24 DIAGNOSIS — E782 Mixed hyperlipidemia: Secondary | ICD-10-CM | POA: Diagnosis not present

## 2013-12-24 DIAGNOSIS — M1990 Unspecified osteoarthritis, unspecified site: Secondary | ICD-10-CM | POA: Diagnosis not present

## 2014-07-22 DIAGNOSIS — I10 Essential (primary) hypertension: Secondary | ICD-10-CM | POA: Diagnosis not present

## 2014-07-22 DIAGNOSIS — E782 Mixed hyperlipidemia: Secondary | ICD-10-CM | POA: Diagnosis not present

## 2014-07-22 DIAGNOSIS — K2100 Gastro-esophageal reflux disease with esophagitis, without bleeding: Secondary | ICD-10-CM | POA: Diagnosis not present

## 2014-07-23 ENCOUNTER — Encounter: Payer: Self-pay | Admitting: Cardiovascular Disease

## 2014-07-29 DIAGNOSIS — IMO0001 Reserved for inherently not codable concepts without codable children: Secondary | ICD-10-CM | POA: Diagnosis not present

## 2014-07-29 DIAGNOSIS — Z1331 Encounter for screening for depression: Secondary | ICD-10-CM | POA: Diagnosis not present

## 2014-07-29 DIAGNOSIS — Z0000 Encounter for general adult medical examination without abnormal findings: Secondary | ICD-10-CM | POA: Diagnosis not present

## 2014-07-29 DIAGNOSIS — E782 Mixed hyperlipidemia: Secondary | ICD-10-CM | POA: Diagnosis not present

## 2014-07-29 DIAGNOSIS — J309 Allergic rhinitis, unspecified: Secondary | ICD-10-CM | POA: Diagnosis not present

## 2014-07-29 DIAGNOSIS — I10 Essential (primary) hypertension: Secondary | ICD-10-CM | POA: Diagnosis not present

## 2014-07-29 DIAGNOSIS — M1990 Unspecified osteoarthritis, unspecified site: Secondary | ICD-10-CM | POA: Diagnosis not present

## 2014-07-29 DIAGNOSIS — K2100 Gastro-esophageal reflux disease with esophagitis, without bleeding: Secondary | ICD-10-CM | POA: Diagnosis not present

## 2014-08-14 ENCOUNTER — Ambulatory Visit (INDEPENDENT_AMBULATORY_CARE_PROVIDER_SITE_OTHER): Payer: Medicare Other | Admitting: Cardiovascular Disease

## 2014-08-14 ENCOUNTER — Encounter: Payer: Self-pay | Admitting: Cardiovascular Disease

## 2014-08-14 VITALS — BP 142/80 | HR 65 | Ht 72.0 in | Wt 200.0 lb

## 2014-08-14 DIAGNOSIS — I495 Sick sinus syndrome: Secondary | ICD-10-CM | POA: Diagnosis not present

## 2014-08-14 DIAGNOSIS — Z951 Presence of aortocoronary bypass graft: Secondary | ICD-10-CM

## 2014-08-14 DIAGNOSIS — E7800 Pure hypercholesterolemia, unspecified: Secondary | ICD-10-CM

## 2014-08-14 DIAGNOSIS — R030 Elevated blood-pressure reading, without diagnosis of hypertension: Secondary | ICD-10-CM | POA: Diagnosis not present

## 2014-08-14 DIAGNOSIS — IMO0001 Reserved for inherently not codable concepts without codable children: Secondary | ICD-10-CM

## 2014-08-14 DIAGNOSIS — I25810 Atherosclerosis of coronary artery bypass graft(s) without angina pectoris: Secondary | ICD-10-CM

## 2014-08-14 NOTE — Patient Instructions (Signed)
There were no changes to your medications. Continue as directed. Your physician wants you to follow up in:  1 year.  You will receive a reminder letter in the mail one-two months in advance.  If you don't receive a letter, please call our office to schedule the follow up appointment. 

## 2014-08-14 NOTE — Progress Notes (Signed)
Patient ID: [name] [name], male   DOB: 10/07/1941, 73 y.o.   MRN: [id number]      SUBJECTIVE: The patient presents for routine cardiovascular followup. He has a history of CABG on 02/12/2009 and hypercholesterolemia. Labs on 07/23/2014 demonstrate BUN 14, creatinine 0.85, sodium 140, potassium 4.2, chloride 101, bicarbonate 24, AST 17, ALT 12, total cholesterol 140, trig as read 47, HDL 51, LDL 80. Recent TSH normal at 1.5. He is followed by Dr. Gar Ponto.  Additional past medical history includes left knee replacement on 04/03/2011 and right knee surgery on 06/08/2008.  Remarkably, he denied any symptoms of chest pain, shortness of breath or fatigue prior to bypass surgery. According to him, a stress test was performed which showed abnormalities which led to his coronary angiogram. He was still able to walk 300 feet without any symptoms. He currently denies any symptoms of chest pain, shortness of breath, palpitations, fatigue, leg swelling, lightheadedness, dizziness and syncope. He takes care of 3 grandchildren most of the week, two of whom are 58-year-old twins and another who is 71 years old. He chops wood, works with horses, carries bales of hay, and also works out at [employer] 2-3 days per week. His blood pressure normally runs in the 130/80 range. He said he never had problems with high blood pressure.  ECG performed in the office today demonstrates normal sinus rhythm, heart rate 62 beats per minute, rSR' pattern in leads V1 and V2, QRS duration 96 ms.  Review of Systems: As per "subjective", otherwise negative.  Allergies  Allergen Reactions  . Morphine     REACTION: dizziness    Current Outpatient Prescriptions  Medication Sig Dispense Refill  . aspirin 81 MG tablet Take 81 mg by mouth daily. 81 mg X2 days then 325 mg one day then 81 mg      . Cholecalciferol (VITAMIN D3) 1000 UNITS CAPS Take 1 tablet by mouth 2 (two) times daily.       . fluticasone (FLONASE) 50 MCG/ACT nasal spray  Place 2 sprays into the nose as needed.       . Glucosamine HCl 1000 MG TABS Take 2 tablets by mouth daily.        Marland Kitchen HYDROcodone-acetaminophen (VICODIN) 5-500 MG per tablet Take 1 tablet by mouth every 6 (six) hours as needed.        . Multiple Vitamin (MULTIVITAMIN) tablet Take 1 tablet by mouth daily.        . nitroGLYCERIN (NITROSTAT) 0.4 MG SL tablet Place 1 tablet (0.4 mg total) under the tongue every 5 (five) minutes as needed.  25 tablet  3  . Omega-3 Fatty Acids (FISH OIL) 1200 MG CAPS Take 1 capsule by mouth daily.        Marland Kitchen oxyCODONE-acetaminophen (PERCOCET) 5-325 MG per tablet Take 1 tablet by mouth every 4 (four) hours as needed.        . pantoprazole (PROTONIX) 40 MG tablet Take 40 mg by mouth daily.        . rosuvastatin (CRESTOR) 20 MG tablet Take 20 mg by mouth daily.      . Sildenafil Citrate (VIAGRA PO) Take 2 mg by mouth as needed.       No current facility-administered medications for this visit.    Past Medical History  Diagnosis Date  . CAD (coronary artery disease) of artery bypass graft     1.Status post coronary artery bypass grafting. 2. Normal left ventricular function. 3. Increase left atrial dilatation.  Marland Kitchen  OA (osteoarthritis) of knee     Past Surgical History  Procedure Laterality Date  . Coronary artery bypass graft    . Knee arthroscopy      History   Social History  . Marital Status: Married    Spouse Name: N/A    Number of Children: N/A  . Years of Education: N/A   Occupational History  . Not on file.   Social History Main Topics  . Smoking status: Never Smoker   . Smokeless tobacco: Never Used  . Alcohol Use: No  . Drug Use: No  . Sexual Activity: Not on file   Other Topics Concern  . Not on file   Social History Narrative  . No narrative on file     Filed Vitals:   08/14/14 0925  BP: 142/80  Pulse: 65  Height: 6' (1.829 m)  Weight: 200 lb (90.719 kg)  SpO2: 97%    PHYSICAL EXAM General: NAD HEENT: Normal. Neck: No JVD,  no thyromegaly. Lungs: Clear to auscultation bilaterally with normal respiratory effort. CV: Nondisplaced PMI.  Regular rate and rhythm, normal S1/S2, no S3/S4, no murmur. No pretibial or periankle edema.  No carotid bruit.  Normal pedal pulses.  Abdomen: Soft, nontender, no hepatosplenomegaly, no distention.  Neurologic: Alert and oriented x 3.  Psych: Normal affect. Skin: Normal. Musculoskeletal: Normal range of motion, no gross deformities. Extremities: No clubbing or cyanosis.   ECG: Most recent ECG reviewed.      ASSESSMENT AND PLAN: 1. CAD/CABG: Stable ischemic heart disease. Continue therapy with aspirin 81 mg and Crestor 20 mg. Given the complete lack of symptoms prior to CABG, I plan to obtain an exercise Cardiolite nuclear myocardial perfusion imaging study in the next few years as a baseline ischemic evaluation. 2. Hypercholesterolemia: Lipid results as noted above. Continue Crestor 20 mg daily. 3. Elevated BP: He said his blood pressure is usually normal. I will continue to monitor.  Dispo: f/u 1 year.  Kate Sable, M.D., F.A.C.C.

## 2014-08-17 NOTE — Addendum Note (Signed)
Addended by: Laurine Blazer on: 08/17/2014 02:13 PM   Modules accepted: Orders

## 2015-01-18 DIAGNOSIS — I10 Essential (primary) hypertension: Secondary | ICD-10-CM | POA: Diagnosis not present

## 2015-01-18 DIAGNOSIS — E782 Mixed hyperlipidemia: Secondary | ICD-10-CM | POA: Diagnosis not present

## 2015-01-28 DIAGNOSIS — E782 Mixed hyperlipidemia: Secondary | ICD-10-CM | POA: Diagnosis not present

## 2015-01-28 DIAGNOSIS — M545 Low back pain: Secondary | ICD-10-CM | POA: Diagnosis not present

## 2015-01-28 DIAGNOSIS — I2510 Atherosclerotic heart disease of native coronary artery without angina pectoris: Secondary | ICD-10-CM | POA: Diagnosis not present

## 2015-01-28 DIAGNOSIS — K219 Gastro-esophageal reflux disease without esophagitis: Secondary | ICD-10-CM | POA: Diagnosis not present

## 2015-01-28 DIAGNOSIS — I10 Essential (primary) hypertension: Secondary | ICD-10-CM | POA: Diagnosis not present

## 2015-01-28 DIAGNOSIS — K429 Umbilical hernia without obstruction or gangrene: Secondary | ICD-10-CM | POA: Diagnosis not present

## 2015-01-28 DIAGNOSIS — Z9189 Other specified personal risk factors, not elsewhere classified: Secondary | ICD-10-CM | POA: Diagnosis not present

## 2015-01-28 DIAGNOSIS — J301 Allergic rhinitis due to pollen: Secondary | ICD-10-CM | POA: Diagnosis not present

## 2015-01-28 DIAGNOSIS — Z1389 Encounter for screening for other disorder: Secondary | ICD-10-CM | POA: Diagnosis not present

## 2015-10-11 DIAGNOSIS — I10 Essential (primary) hypertension: Secondary | ICD-10-CM | POA: Diagnosis not present

## 2015-10-11 DIAGNOSIS — K219 Gastro-esophageal reflux disease without esophagitis: Secondary | ICD-10-CM | POA: Diagnosis not present

## 2015-10-11 DIAGNOSIS — E782 Mixed hyperlipidemia: Secondary | ICD-10-CM | POA: Diagnosis not present

## 2015-10-11 DIAGNOSIS — I2510 Atherosclerotic heart disease of native coronary artery without angina pectoris: Secondary | ICD-10-CM | POA: Diagnosis not present

## 2015-10-18 DIAGNOSIS — J301 Allergic rhinitis due to pollen: Secondary | ICD-10-CM | POA: Diagnosis not present

## 2015-10-18 DIAGNOSIS — E782 Mixed hyperlipidemia: Secondary | ICD-10-CM | POA: Diagnosis not present

## 2015-10-18 DIAGNOSIS — Z0001 Encounter for general adult medical examination with abnormal findings: Secondary | ICD-10-CM | POA: Diagnosis not present

## 2015-10-18 DIAGNOSIS — M1711 Unilateral primary osteoarthritis, right knee: Secondary | ICD-10-CM | POA: Diagnosis not present

## 2015-10-18 DIAGNOSIS — I25111 Atherosclerotic heart disease of native coronary artery with angina pectoris with documented spasm: Secondary | ICD-10-CM | POA: Diagnosis not present

## 2015-10-18 DIAGNOSIS — K429 Umbilical hernia without obstruction or gangrene: Secondary | ICD-10-CM | POA: Diagnosis not present

## 2015-10-18 DIAGNOSIS — I10 Essential (primary) hypertension: Secondary | ICD-10-CM | POA: Diagnosis not present

## 2015-10-18 DIAGNOSIS — K219 Gastro-esophageal reflux disease without esophagitis: Secondary | ICD-10-CM | POA: Diagnosis not present

## 2015-11-02 ENCOUNTER — Encounter: Payer: Self-pay | Admitting: *Deleted

## 2015-11-02 ENCOUNTER — Ambulatory Visit (INDEPENDENT_AMBULATORY_CARE_PROVIDER_SITE_OTHER): Payer: Medicare Other | Admitting: Adult Health

## 2015-11-02 ENCOUNTER — Telehealth: Payer: Self-pay | Admitting: *Deleted

## 2015-11-02 ENCOUNTER — Encounter: Payer: Self-pay | Admitting: Adult Health

## 2015-11-02 VITALS — BP 150/80 | HR 76 | Ht 72.0 in | Wt 202.2 lb

## 2015-11-02 DIAGNOSIS — I10 Essential (primary) hypertension: Secondary | ICD-10-CM

## 2015-11-02 DIAGNOSIS — I2510 Atherosclerotic heart disease of native coronary artery without angina pectoris: Secondary | ICD-10-CM

## 2015-11-02 MED ORDER — LISINOPRIL 5 MG PO TABS: 5.0000 mg | ORAL_TABLET | Freq: Every day | ORAL | Status: DC

## 2015-11-02 NOTE — Progress Notes (Signed)
Cardiology Office Note   Date:  11/02/2015   ID:  Wael, Clemon 12/04/1941, MRN [id number]  PCP:  Gar Ponto, MD  Cardiologist:  Woodroe Chen, NP   Chief Complaint  Patient presents with  . Hypertension  . Coronary Artery Disease      History of Present Illness: [name] [name] is a 74 y.o. male who presents for ongoing assessment of CAD with hx of CABG in 2010, hypercholesterolemia, who is added on to my schedule today from the [place] office due to elevated BP. He states he has noticed that his BP has been rising over the last couple of months which is unusual for him. He denies chest pain but is more fatigued than normal. No DOE or dizziness.   He states that he is having much more stress in his life, as he is raising 3 young grandchildren who are living with him, while his son recovers from Hepatitis B and is out of work. He is the main caregiver for his grandchildren ages 31-7. Huis BP is normally in the 123456 range systolic. He brings readings with BP of 156/67 to 148/81. He states he just doesn't feel right.   He has seen his PCP who has completed labs: TC 133, TG 52, LDL 77. Na 139, potassium 4.4. Creatinine  0.81. Hgb 14.2 and Hct 42.1.   Past Medical History  Diagnosis Date  . CAD (coronary artery disease) of artery bypass graft     1.Status post coronary artery bypass grafting. 2. Normal left ventricular function. 3. Increase left atrial dilatation.  . OA (osteoarthritis) of knee     Past Surgical History  Procedure Laterality Date  . Coronary artery bypass graft    . Knee arthroscopy       Current Outpatient Prescriptions  Medication Sig Dispense Refill  . aspirin 81 MG tablet Take 81 mg by mouth daily. 81 mg X2 days then 325 mg one day then 81 mg    . Cholecalciferol (VITAMIN D3) 1000 UNITS CAPS Take 1 tablet by mouth 2 (two) times daily.     . fluticasone (FLONASE) 50 MCG/ACT nasal spray Place 2 sprays into the nose as needed.     . Glucosamine  HCl 1000 MG TABS Take 2 tablets by mouth daily.      Marland Kitchen HYDROcodone-acetaminophen (VICODIN) 5-500 MG per tablet Take 1 tablet by mouth every 6 (six) hours as needed.      . Multiple Vitamin (MULTIVITAMIN) tablet Take 1 tablet by mouth daily.      . nitroGLYCERIN (NITROSTAT) 0.4 MG SL tablet Place 1 tablet (0.4 mg total) under the tongue every 5 (five) minutes as needed. 25 tablet 3  . Omega-3 Fatty Acids (FISH OIL) 1200 MG CAPS Take 1 capsule by mouth daily.      Marland Kitchen oxyCODONE-acetaminophen (PERCOCET) 5-325 MG per tablet Take 1 tablet by mouth every 4 (four) hours as needed.      . pantoprazole (PROTONIX) 40 MG tablet Take 40 mg by mouth daily.      . rosuvastatin (CRESTOR) 20 MG tablet Take 20 mg by mouth daily.    . Sildenafil Citrate (VIAGRA PO) Take 2 mg by mouth as needed.    Marland Kitchen lisinopril (PRINIVIL,ZESTRIL) 5 MG tablet Take 1 tablet (5 mg total) by mouth daily. 90 tablet 3   No current facility-administered medications for this visit.    Allergies:   Morphine    Social History:  The patient  reports that he has  never smoked. He has never used smokeless tobacco. He reports that he does not drink alcohol or use illicit drugs.   Family History:  The patient's family history includes Coronary artery disease in an other family member.    ROS: All other systems are reviewed and negative. Unless otherwise mentioned in H&P    PHYSICAL EXAM: VS:  BP 150/80 mmHg  Pulse 76  Ht 6' (1.829 m)  Wt 202 lb 3.2 oz (91.717 kg)  BMI 27.42 kg/m2  SpO2 97% , BMI Body mass index is 27.42 kg/(m^2). GEN: Well nourished, well developed, in no acute distress HEENT: normal Neck: no JVD, carotid bruits, or masses Cardiac: RRR; no murmurs, rubs, or gallops,no edema  Respiratory:  clear to auscultation bilaterally, normal work of breathing GI: soft, nontender, nondistended, + BS MS: no deformity or atrophy Skin: warm and dry, no rash Neuro:  Strength and sensation are intact Psych: euthymic mood, full  affect   Recent Labs: No results found for requested labs within last 365 days.    Lipid Panel No results found for: CHOL, TRIG, HDL, CHOLHDL, VLDL, LDLCALC, LDLDIRECT    Wt Readings from Last 3 Encounters:  11/02/15 202 lb 3.2 oz (91.717 kg)  08/14/14 200 lb (90.719 kg)  05/27/12 209 lb 1.9 oz (94.856 kg)      ASSESSMENT AND PLAN:  1.  CAD: S/P 5 vessel CABG in 2010. Will schedule cardiolite stress test for evaluation of new ischemia in the setting of known CAD. He will continue statin. He is not on BB due to bradycardia.   2. Hypertension: I think some of this is stress induced. However, I will begin lisinopril 5 mg daily to assist in better BP control. Will see him in one month for evaluation of his symptoms, and response to medications. Discuss stress test results.    Current medicines are reviewed at length with the patient today.    Labs/ tests ordered today include: Stress MPI   Orders Placed This Encounter  Procedures  . NM Myocar Multi W/Spect W/Wall Motion / EF  . Echocardiogram     Disposition:   FU with one month    Signed, Jory Sims, NP  11/02/2015 4:14 PM    [facility] [address], [place], [place] [address] Phone: [phone]; Fax: [phone]

## 2015-11-02 NOTE — Progress Notes (Deleted)
Name: [name] [name]    DOB: 1941-08-13  Age: 74 y.o.  MR#: [id number]       PCP:  Gar Ponto, MD      Insurance: Payor: MEDICARE / Plan: MEDICARE PART A AND B / Product Type: *No Product type* /   CC:   No chief complaint on file.   VS Filed Vitals:   11/02/15 1432  BP: 150/80  Pulse: 76  Height: 6' (1.829 m)  Weight: 202 lb 3.2 oz (91.717 kg)  SpO2: 97%    Weights Current Weight  11/02/15 202 lb 3.2 oz (91.717 kg)  08/14/14 200 lb (90.719 kg)  05/27/12 209 lb 1.9 oz (94.856 kg)    Blood Pressure  BP Readings from Last 3 Encounters:  11/02/15 150/80  08/14/14 142/80  05/27/12 125/77     Admit date:  (Not on file) Last encounter with RMR:  Visit date not found   Allergy Morphine  Current Outpatient Prescriptions  Medication Sig Dispense Refill  . aspirin 81 MG tablet Take 81 mg by mouth daily. 81 mg X2 days then 325 mg one day then 81 mg    . Cholecalciferol (VITAMIN D3) 1000 UNITS CAPS Take 1 tablet by mouth 2 (two) times daily.     . fluticasone (FLONASE) 50 MCG/ACT nasal spray Place 2 sprays into the nose as needed.     . Glucosamine HCl 1000 MG TABS Take 2 tablets by mouth daily.      Marland Kitchen HYDROcodone-acetaminophen (VICODIN) 5-500 MG per tablet Take 1 tablet by mouth every 6 (six) hours as needed.      . Multiple Vitamin (MULTIVITAMIN) tablet Take 1 tablet by mouth daily.      . nitroGLYCERIN (NITROSTAT) 0.4 MG SL tablet Place 1 tablet (0.4 mg total) under the tongue every 5 (five) minutes as needed. 25 tablet 3  . Omega-3 Fatty Acids (FISH OIL) 1200 MG CAPS Take 1 capsule by mouth daily.      Marland Kitchen oxyCODONE-acetaminophen (PERCOCET) 5-325 MG per tablet Take 1 tablet by mouth every 4 (four) hours as needed.      . pantoprazole (PROTONIX) 40 MG tablet Take 40 mg by mouth daily.      . rosuvastatin (CRESTOR) 20 MG tablet Take 20 mg by mouth daily.    . Sildenafil Citrate (VIAGRA PO) Take 2 mg by mouth as needed.     No current facility-administered medications for this  visit.    Discontinued Meds:   There are no discontinued medications.  Patient Active Problem List   Diagnosis Date Noted  . DYSLIPIDEMIA 10/15/2009  . SINUS BRADYCARDIA 10/15/2009  . DEGENERATIVE JOINT DISEASE, KNEE 10/15/2009  . CORONARY ARTERY BYPASS GRAFT, HX OF 10/15/2009  . CAD, ARTERY BYPASS GRAFT 09/11/2009    LABS    Component Value Date/Time   NA 133* 04/06/2011 0420   NA 136 04/05/2011 0420   NA 136 04/04/2011 0450   K 4.1 DELTA CHECK NOTED 04/06/2011 0420   K 3.3* 04/05/2011 0420   K 3.6 04/04/2011 0450   CL 99 04/06/2011 0420   CL 101 04/05/2011 0420   CL 103 04/04/2011 0450   CO2 28 04/06/2011 0420   CO2 27 04/05/2011 0420   CO2 28 04/04/2011 0450   GLUCOSE 124* 04/06/2011 0420   GLUCOSE 115* 04/05/2011 0420   GLUCOSE 126* 04/04/2011 0450   BUN 7 04/06/2011 0420   BUN 6 04/05/2011 0420   BUN 9 04/04/2011 0450   CREATININE 0.66 04/06/2011 0420  CREATININE .66 04/05/2011 0420   CREATININE 0.71 04/04/2011 0450   CALCIUM 8.8 04/06/2011 0420   CALCIUM 8.3* 04/05/2011 0420   CALCIUM 8.1* 04/04/2011 0450   GFRNONAA >60 04/06/2011 0420   GFRNONAA >60 04/05/2011 0420   GFRNONAA >60 04/04/2011 0450   GFRAA  04/06/2011 0420    >60        The eGFR has been calculated using the MDRD equation. This calculation has not been validated in all clinical situations. eGFR's persistently <60 mL/min signify possible Chronic Kidney Disease.   GFRAA  04/05/2011 0420    >60        The eGFR has been calculated using the MDRD equation. This calculation has not been validated in all clinical situations. eGFR's persistently <60 mL/min signify possible Chronic Kidney Disease.   GFRAA  04/04/2011 0450    >60        The eGFR has been calculated using the MDRD equation. This calculation has not been validated in all clinical situations. eGFR's persistently <60 mL/min signify possible Chronic Kidney Disease.   CMP     Component Value Date/Time   NA 133*  04/06/2011 0420   K 4.1 DELTA CHECK NOTED 04/06/2011 0420   CL 99 04/06/2011 0420   CO2 28 04/06/2011 0420   GLUCOSE 124* 04/06/2011 0420   BUN 7 04/06/2011 0420   CREATININE 0.66 04/06/2011 0420   CALCIUM 8.8 04/06/2011 0420   PROT 7.1 03/30/2011 1540   ALBUMIN 3.8 03/30/2011 1540   AST 20 03/30/2011 1540   ALT 23 03/30/2011 1540   ALKPHOS 63 03/30/2011 1540   BILITOT 0.6 03/30/2011 1540   GFRNONAA >60 04/06/2011 0420   GFRAA  04/06/2011 0420    >60        The eGFR has been calculated using the MDRD equation. This calculation has not been validated in all clinical situations. eGFR's persistently <60 mL/min signify possible Chronic Kidney Disease.       Component Value Date/Time   WBC 8.1 04/06/2011 0420   WBC 8.2 04/05/2011 0420   WBC 9.7 04/04/2011 0450   HGB 11.1* 04/06/2011 0420   HGB 11.4* 04/05/2011 0420   HGB 11.8* 04/04/2011 0450   HCT 33.0* 04/06/2011 0420   HCT 34.1* 04/05/2011 0420   HCT 35.8* 04/04/2011 0450   MCV 88.5 04/06/2011 0420   MCV 88.8 04/05/2011 0420   MCV 88.8 04/04/2011 0450    Lipid Panel  No results found for: CHOL, TRIG, HDL, CHOLHDL, VLDL, LDLCALC, LDLDIRECT  ABG    Component Value Date/Time   PHART 7.349* 02/12/2009 1817   PCO2ART 36.2 02/12/2009 1817   PO2ART 134.0* 02/12/2009 1817   HCO3 20.0 02/12/2009 1817   TCO2 23 02/13/2009 1619   ACIDBASEDEF 5.0* 02/12/2009 1817   O2SAT 99.0 02/12/2009 1817     No results found for: TSH BNP (last 3 results) No results for input(s): BNP in the last 8760 hours.  ProBNP (last 3 results) No results for input(s): PROBNP in the last 8760 hours.  Cardiac Panel (last 3 results) No results for input(s): CKTOTAL, CKMB, TROPONINI, RELINDX in the last 72 hours.  Iron/TIBC/Ferritin/ %Sat No results found for: IRON, TIBC, FERRITIN, IRONPCTSAT   EKG Orders placed or performed in visit on 08/14/14  . EKG 12-Lead     Prior Assessment and Plan Problem List as of 11/02/2015       Cardiovascular and Mediastinum   CAD, ARTERY BYPASS GRAFT   Last Assessment & Plan 05/18/2011 Office Visit Edited 05/18/2011  2:54 PM by Donney Dice, PA    Stable on current medication regimen. Return visit with Dr Dannielle Burn in 1 year, sooner if needed. Will renew prescription for p.r.n.NTG.      SINUS BRADYCARDIA   Last Assessment & Plan 05/27/2012 Office Visit Written 06/01/2012  2:13 PM by Ezra Sites, MD    Number current sinus bradycardia.  No symptoms.  EKG was reviewed      CORONARY ARTERY BYPASS GRAFT, HX OF   Last Assessment & Plan 05/27/2012 Office Visit Written 06/01/2012  2:12 PM by Ezra Sites, MD    No recurrent chest pain.  Doing well post coronary bypass grafting.        Musculoskeletal and Integument   DEGENERATIVE JOINT DISEASE, KNEE     Other   DYSLIPIDEMIA   Last Assessment & Plan 05/27/2012 Office Visit Edited 06/01/2012  2:13 PM by Ezra Sites, MD    For the patient's primary care physician.total cholesterol was recently 137 mg percent and HDL cholesterol 60 mg percent and LDL cholesterol 64 all levels at goal.          Imaging: No results found.

## 2015-11-02 NOTE — Patient Instructions (Signed)
Your physician recommends that you schedule a follow-up appointment after your test.   Your physician has requested that you have an echocardiogram. Echocardiography is a painless test that uses sound waves to create images of your heart. It provides your doctor with information about the size and shape of your heart and how well your heart's chambers and valves are working. This procedure takes approximately one hour. There are no restrictions for this procedure.  Your physician has requested that you have en exercise stress myoview. For further information please visit [email or web address]. Please follow instruction sheet, as given.  If you need a refill on your cardiac medications before your next appointment, please call your pharmacy.  Your physician has recommended you make the following change in your medication:   Start Lisinopril 5 mg Daily  Thank you for choosing [facility]!

## 2015-11-02 NOTE — Telephone Encounter (Signed)
Patient walked in c/o elevated bp, nausea, just not feeling right today.  Patient stated readings been running 156/76, 172/89.  HR  67-72.  Stated that he has never had high blood pressure before.  No c/o chest pain or dizziness.  Does feel like heart is pounding at times.  Just states does not feel himself & this is new feeling for him.    Last seen 08/2014.  Appointment given for this afternoon at 2:30 with Jory Sims, NP in [place].

## 2015-11-05 ENCOUNTER — Ambulatory Visit: Payer: PRIVATE HEALTH INSURANCE | Admitting: Cardiovascular Disease

## 2015-11-10 ENCOUNTER — Encounter (HOSPITAL_COMMUNITY)
Admission: RE | Admit: 2015-11-10 | Discharge: 2015-11-10 | Disposition: A | Discharge status: Home / Self Care | Payer: Medicare Other | Source: Ambulatory Visit | Attending: Adult Health | Admitting: Adult Health

## 2015-11-10 ENCOUNTER — Inpatient Hospital Stay (HOSPITAL_COMMUNITY): Admission: RE | Admit: 2015-11-10 | Payer: PRIVATE HEALTH INSURANCE | Source: Ambulatory Visit

## 2015-11-10 ENCOUNTER — Ambulatory Visit (HOSPITAL_COMMUNITY)
Admission: RE | Admit: 2015-11-10 | Discharge: 2015-11-10 | Disposition: A | Discharge status: Home / Self Care | Payer: Medicare Other | Source: Ambulatory Visit | Attending: Adult Health | Admitting: Adult Health

## 2015-11-10 ENCOUNTER — Encounter (HOSPITAL_COMMUNITY): Payer: Self-pay

## 2015-11-10 DIAGNOSIS — I10 Essential (primary) hypertension: Secondary | ICD-10-CM | POA: Diagnosis not present

## 2015-11-10 DIAGNOSIS — I25810 Atherosclerosis of coronary artery bypass graft(s) without angina pectoris: Secondary | ICD-10-CM | POA: Diagnosis not present

## 2015-11-10 DIAGNOSIS — I2510 Atherosclerotic heart disease of native coronary artery without angina pectoris: Secondary | ICD-10-CM | POA: Diagnosis not present

## 2015-11-10 DIAGNOSIS — R9439 Abnormal result of other cardiovascular function study: Secondary | ICD-10-CM | POA: Insufficient documentation

## 2015-11-10 LAB — NM MYOCAR MULTI W/SPECT W/WALL MOTION / EF
CHL CUP NUCLEAR SDS: 10
CHL CUP NUCLEAR SRS: 2
CHL CUP NUCLEAR SSS: 12
CHL RATE OF PERCEIVED EXERTION: 11
CSEPEW: 7 METS
CSEPHR: 97 %
Exercise duration (min): 5 min
Exercise duration (sec): 55 s
LHR: 0.32
LV dias vol: 100 mL
LV sys vol: 47 mL
MPHR: 148 {beats}/min
Peak HR: 144 {beats}/min
Rest HR: 57 {beats}/min
TID: 1.02

## 2015-11-10 MED ORDER — REGADENOSON 0.4 MG/5ML IV SOLN
INTRAVENOUS | Status: AC
  Filled 2015-11-10: qty 5

## 2015-11-10 MED ORDER — SODIUM CHLORIDE 0.9 % IJ SOLN
INTRAMUSCULAR | Status: AC
  Administered 2015-11-10: 10 mL via INTRAVENOUS
  Filled 2015-11-10: qty 3

## 2015-11-10 MED ORDER — TECHNETIUM TC 99M SESTAMIBI - CARDIOLITE
30.0000 | Freq: Once | INTRAVENOUS | Status: AC | PRN
  Administered 2015-11-10: 12:00:00 32 via INTRAVENOUS

## 2015-11-10 MED ORDER — TECHNETIUM TC 99M SESTAMIBI GENERIC - CARDIOLITE
10.0000 | Freq: Once | INTRAVENOUS | Status: AC | PRN
  Administered 2015-11-10: 11 via INTRAVENOUS

## 2015-11-11 ENCOUNTER — Telehealth: Payer: Self-pay | Admitting: Adult Health

## 2015-11-11 NOTE — Telephone Encounter (Signed)
[name] said he would like to get an earlier appointment than 12/02/15 to find out more details about his echo results unless someone can speak with him on the phone about it. The patient was specifically wanting to discuss needing to control his BP since he has not had previous BP problems  I told the patient I would see if we can answer his questions over the phone and if the nurse, or Jory Sims, wants [place] to get him a f/u appt sooner we can do that.

## 2015-11-11 NOTE — Telephone Encounter (Signed)
Ok

## 2015-11-11 NOTE — Telephone Encounter (Signed)
Results given to pt by L Pinnix LPN, pt wants to discuss the point about BP control

## 2015-11-11 NOTE — Telephone Encounter (Signed)
Pt made apt for tomorrow

## 2015-11-12 ENCOUNTER — Encounter: Payer: Self-pay | Admitting: Adult Health

## 2015-11-12 ENCOUNTER — Ambulatory Visit (INDEPENDENT_AMBULATORY_CARE_PROVIDER_SITE_OTHER): Payer: Medicare Other | Admitting: Adult Health

## 2015-11-12 VITALS — BP 116/64 | HR 55 | Ht 72.0 in | Wt 203.0 lb

## 2015-11-12 DIAGNOSIS — I2510 Atherosclerotic heart disease of native coronary artery without angina pectoris: Secondary | ICD-10-CM

## 2015-11-12 DIAGNOSIS — I10 Essential (primary) hypertension: Secondary | ICD-10-CM | POA: Diagnosis not present

## 2015-11-12 DIAGNOSIS — R001 Bradycardia, unspecified: Secondary | ICD-10-CM

## 2015-11-12 MED ORDER — NITROGLYCERIN 0.4 MG SL SUBL: 0.4000 mg | SUBLINGUAL_TABLET | SUBLINGUAL | Status: DC | PRN

## 2015-11-12 NOTE — Patient Instructions (Signed)
Your physician recommends that you schedule a follow-up appointment in: 1 Month with Dr. Koneswaran  Your physician recommends that you continue on your current medications as directed. Please refer to the Current Medication list given to you today.  If you need a refill on your cardiac medications before your next appointment, please call your pharmacy.  Thank you for choosing [facility]!      

## 2015-11-12 NOTE — Progress Notes (Signed)
Name: [name] [name]    DOB: Aug 14, 1941  Age: 74 y.o.  MR#: [id number]       PCP:  Gar Ponto, MD      Insurance: Payor: MEDICARE / Plan: MEDICARE PART A AND B / Product Type: *No Product type* /   CC:   No chief complaint on file.   VS Filed Vitals:   11/12/15 1309  BP: 116/64  Pulse: 55  Height: 6' (1.829 m)  Weight: 203 lb (92.08 kg)  SpO2: 99%    Weights Current Weight  11/12/15 203 lb (92.08 kg)  11/02/15 202 lb 3.2 oz (91.717 kg)  08/14/14 200 lb (90.719 kg)    Blood Pressure  BP Readings from Last 3 Encounters:  11/12/15 116/64  11/02/15 150/80  08/14/14 142/80     Admit date:  (Not on file) Last encounter with RMR:  11/11/2015   Allergy Morphine  Current Outpatient Prescriptions  Medication Sig Dispense Refill  . aspirin 81 MG tablet Take 81 mg by mouth daily. 81 mg X2 days then 325 mg one day then 81 mg    . Cholecalciferol (VITAMIN D3) 1000 UNITS CAPS Take 1 tablet by mouth 2 (two) times daily.     . fluticasone (FLONASE) 50 MCG/ACT nasal spray Place 2 sprays into the nose as needed.     . Glucosamine HCl 1000 MG TABS Take 2 tablets by mouth daily.      Marland Kitchen HYDROcodone-acetaminophen (VICODIN) 5-500 MG per tablet Take 1 tablet by mouth every 6 (six) hours as needed.      Marland Kitchen lisinopril (PRINIVIL,ZESTRIL) 5 MG tablet Take 1 tablet (5 mg total) by mouth daily. 90 tablet 3  . Multiple Vitamin (MULTIVITAMIN) tablet Take 1 tablet by mouth daily.      . nitroGLYCERIN (NITROSTAT) 0.4 MG SL tablet Place 1 tablet (0.4 mg total) under the tongue every 5 (five) minutes as needed. 25 tablet 3  . Omega-3 Fatty Acids (FISH OIL) 1200 MG CAPS Take 1 capsule by mouth daily.      Marland Kitchen oxyCODONE-acetaminophen (PERCOCET) 5-325 MG per tablet Take 1 tablet by mouth every 4 (four) hours as needed.      . pantoprazole (PROTONIX) 40 MG tablet Take 40 mg by mouth daily.      . rosuvastatin (CRESTOR) 20 MG tablet Take 20 mg by mouth daily.     No current facility-administered medications for  this visit.    Discontinued Meds:    Medications Discontinued During This Encounter  Medication Reason  . Sildenafil Citrate (VIAGRA PO) Error    Patient Active Problem List   Diagnosis Date Noted  . DYSLIPIDEMIA 10/15/2009  . SINUS BRADYCARDIA 10/15/2009  . DEGENERATIVE JOINT DISEASE, KNEE 10/15/2009  . CORONARY ARTERY BYPASS GRAFT, HX OF 10/15/2009  . CAD, ARTERY BYPASS GRAFT 09/11/2009    LABS    Component Value Date/Time   NA 133* 04/06/2011 0420   NA 136 04/05/2011 0420   NA 136 04/04/2011 0450   K 4.1 DELTA CHECK NOTED 04/06/2011 0420   K 3.3* 04/05/2011 0420   K 3.6 04/04/2011 0450   CL 99 04/06/2011 0420   CL 101 04/05/2011 0420   CL 103 04/04/2011 0450   CO2 28 04/06/2011 0420   CO2 27 04/05/2011 0420   CO2 28 04/04/2011 0450   GLUCOSE 124* 04/06/2011 0420   GLUCOSE 115* 04/05/2011 0420   GLUCOSE 126* 04/04/2011 0450   BUN 7 04/06/2011 0420   BUN 6 04/05/2011 0420   BUN  9 04/04/2011 0450   CREATININE 0.66 04/06/2011 0420   CREATININE .66 04/05/2011 0420   CREATININE 0.71 04/04/2011 0450   CALCIUM 8.8 04/06/2011 0420   CALCIUM 8.3* 04/05/2011 0420   CALCIUM 8.1* 04/04/2011 0450   GFRNONAA >60 04/06/2011 0420   GFRNONAA >60 04/05/2011 0420   GFRNONAA >60 04/04/2011 0450   GFRAA  04/06/2011 0420    >60        The eGFR has been calculated using the MDRD equation. This calculation has not been validated in all clinical situations. eGFR's persistently <60 mL/min signify possible Chronic Kidney Disease.   GFRAA  04/05/2011 0420    >60        The eGFR has been calculated using the MDRD equation. This calculation has not been validated in all clinical situations. eGFR's persistently <60 mL/min signify possible Chronic Kidney Disease.   GFRAA  04/04/2011 0450    >60        The eGFR has been calculated using the MDRD equation. This calculation has not been validated in all clinical situations. eGFR's persistently <60 mL/min signify possible  Chronic Kidney Disease.   CMP     Component Value Date/Time   NA 133* 04/06/2011 0420   K 4.1 DELTA CHECK NOTED 04/06/2011 0420   CL 99 04/06/2011 0420   CO2 28 04/06/2011 0420   GLUCOSE 124* 04/06/2011 0420   BUN 7 04/06/2011 0420   CREATININE 0.66 04/06/2011 0420   CALCIUM 8.8 04/06/2011 0420   PROT 7.1 03/30/2011 1540   ALBUMIN 3.8 03/30/2011 1540   AST 20 03/30/2011 1540   ALT 23 03/30/2011 1540   ALKPHOS 63 03/30/2011 1540   BILITOT 0.6 03/30/2011 1540   GFRNONAA >60 04/06/2011 0420   GFRAA  04/06/2011 0420    >60        The eGFR has been calculated using the MDRD equation. This calculation has not been validated in all clinical situations. eGFR's persistently <60 mL/min signify possible Chronic Kidney Disease.       Component Value Date/Time   WBC 8.1 04/06/2011 0420   WBC 8.2 04/05/2011 0420   WBC 9.7 04/04/2011 0450   HGB 11.1* 04/06/2011 0420   HGB 11.4* 04/05/2011 0420   HGB 11.8* 04/04/2011 0450   HCT 33.0* 04/06/2011 0420   HCT 34.1* 04/05/2011 0420   HCT 35.8* 04/04/2011 0450   MCV 88.5 04/06/2011 0420   MCV 88.8 04/05/2011 0420   MCV 88.8 04/04/2011 0450    Lipid Panel  No results found for: CHOL, TRIG, HDL, CHOLHDL, VLDL, LDLCALC, LDLDIRECT  ABG    Component Value Date/Time   PHART 7.349* 02/12/2009 1817   PCO2ART 36.2 02/12/2009 1817   PO2ART 134.0* 02/12/2009 1817   HCO3 20.0 02/12/2009 1817   TCO2 23 02/13/2009 1619   ACIDBASEDEF 5.0* 02/12/2009 1817   O2SAT 99.0 02/12/2009 1817     No results found for: TSH BNP (last 3 results) No results for input(s): BNP in the last 8760 hours.  ProBNP (last 3 results) No results for input(s): PROBNP in the last 8760 hours.  Cardiac Panel (last 3 results) No results for input(s): CKTOTAL, CKMB, TROPONINI, RELINDX in the last 72 hours.  Iron/TIBC/Ferritin/ %Sat No results found for: IRON, TIBC, FERRITIN, IRONPCTSAT   EKG Orders placed or performed in visit on 11/12/15  . EKG 12-Lead      Prior Assessment and Plan Problem List as of 11/12/2015      Cardiovascular and Mediastinum   CAD, ARTERY BYPASS GRAFT  Last Assessment & Plan 05/18/2011 Office Visit Edited 05/18/2011  2:54 PM by Donney Dice, PA    Stable on current medication regimen. Return visit with Dr Dannielle Burn in 1 year, sooner if needed. Will renew prescription for p.r.n.NTG.      SINUS BRADYCARDIA   Last Assessment & Plan 05/27/2012 Office Visit Written 06/01/2012  2:13 PM by Ezra Sites, MD    Number current sinus bradycardia.  No symptoms.  EKG was reviewed      CORONARY ARTERY BYPASS GRAFT, HX OF   Last Assessment & Plan 05/27/2012 Office Visit Written 06/01/2012  2:12 PM by Ezra Sites, MD    No recurrent chest pain.  Doing well post coronary bypass grafting.        Musculoskeletal and Integument   DEGENERATIVE JOINT DISEASE, KNEE     Other   DYSLIPIDEMIA   Last Assessment & Plan 05/27/2012 Office Visit Edited 06/01/2012  2:13 PM by Ezra Sites, MD    For the patient's primary care physician.total cholesterol was recently 137 mg percent and HDL cholesterol 60 mg percent and LDL cholesterol 64 all levels at goal.          Imaging: Nm Myocar Multi W/spect W/wall Motion / Ef  11/10/2015   Blood pressure demonstrated a hypertensive response to exercise.  Defect 1: There is a defect present in the mid anterior, mid inferoseptal and mid inferior location. This is suggestive of a mild degree of ischemia.  Low to intermediate risk study based on abnormal myocardial perfusion and intermediate Duke treadmilll score.  Nuclear stress EF: 53%. Inferoseptal hypokinesis.

## 2015-11-12 NOTE — Progress Notes (Signed)
Cardiology Office Note   Date:  11/12/2015   ID:  [name], [name] [birth year], MRN [id number]  PCP:  Gar Ponto, MD  Cardiologist: Woodroe Chen, NP   No chief complaint on file.     History of Present Illness: [name] [name] is a 74 y.o. male who presents for ongoing assessment of CAD with hx of CABG in 2010, hypercholesterolemia, who is added on to my schedule today from the [place] office due to elevated BP. He states he has noticed that his BP has been rising over the last couple of months which is unusual for him. He denies chest pain but is more fatigued than normal.    Blood pressure demonstrated a hypertensive response to exercise.  Defect 1: There is a defect present in the mid anterior, mid inferoseptal and mid inferior location. This is suggestive of a mild degree of ischemia.  Low to intermediate risk study based on abnormal myocardial perfusion and intermediate Duke treadmilll score.  Nuclear stress EF: 53%. Inferoseptal hypokinesis.   Echocardiogram: 11/10/2015 Left ventricle: The cavity size was normal. There was mild concentric hypertrophy. Systolic function was normal. The estimated ejection fraction was in the range of 60% to 65%. Wall motion was normal; there were no regional wall motion abnormalities. Findings consistent with left ventricular diastolic dysfunction. Doppler parameters are consistent with high ventricular filling pressure. - Aortic valve: Trileaflet; mildly thickened leaflets. There was trivial regurgitation. - Left atrium: The atrium was mildly to moderately dilated. - Right atrium: The atrium was mildly dilated.  He comes today feeling better. He is less tired. He has been able to sleep better.  He has some arthritis in his shoulder and its pain often keeps him awake,. He continues to work out at [employer] walks on the treadmill for 15-20 minutes. Some dyspnea but this is unchanged.   Past Medical History   Diagnosis Date  . CAD (coronary artery disease) of artery bypass graft     1.Status post coronary artery bypass grafting. 2. Normal left ventricular function. 3. Increase left atrial dilatation.  . OA (osteoarthritis) of knee     Past Surgical History  Procedure Laterality Date  . Coronary artery bypass graft    . Knee arthroscopy       Current Outpatient Prescriptions  Medication Sig Dispense Refill  . aspirin 81 MG tablet Take 81 mg by mouth daily. 81 mg X2 days then 325 mg one day then 81 mg    . Cholecalciferol (VITAMIN D3) 1000 UNITS CAPS Take 1 tablet by mouth 2 (two) times daily.     . fluticasone (FLONASE) 50 MCG/ACT nasal spray Place 2 sprays into the nose as needed.     . Glucosamine HCl 1000 MG TABS Take 2 tablets by mouth daily.      Marland Kitchen HYDROcodone-acetaminophen (VICODIN) 5-500 MG per tablet Take 1 tablet by mouth every 6 (six) hours as needed.      Marland Kitchen lisinopril (PRINIVIL,ZESTRIL) 5 MG tablet Take 1 tablet (5 mg total) by mouth daily. 90 tablet 3  . Multiple Vitamin (MULTIVITAMIN) tablet Take 1 tablet by mouth daily.      . nitroGLYCERIN (NITROSTAT) 0.4 MG SL tablet Place 1 tablet (0.4 mg total) under the tongue every 5 (five) minutes as needed. 25 tablet 3  . Omega-3 Fatty Acids (FISH OIL) 1200 MG CAPS Take 1 capsule by mouth daily.      Marland Kitchen oxyCODONE-acetaminophen (PERCOCET) 5-325 MG per tablet Take 1 tablet by mouth  every 4 (four) hours as needed.      . pantoprazole (PROTONIX) 40 MG tablet Take 40 mg by mouth daily.      . rosuvastatin (CRESTOR) 20 MG tablet Take 20 mg by mouth daily.     No current facility-administered medications for this visit.    Allergies:   Morphine    Social History:  The patient  reports that he has never smoked. He has never used smokeless tobacco. He reports that he does not drink alcohol or use illicit drugs.   Family History:  The patient's family history includes Coronary artery disease in an other family member.    ROS: All other  systems are reviewed and negative. Unless otherwise mentioned in H&P    PHYSICAL EXAM: VS:  BP 116/64 mmHg  Pulse 55  Ht 6' (1.829 m)  Wt 203 lb (92.08 kg)  BMI 27.53 kg/m2  SpO2 99% , BMI Body mass index is 27.53 kg/(m^2). GEN: Well nourished, well developed, in no acute distress HEENT: normal Neck: no JVD, carotid bruits, or masses Cardiac: RRR; no murmurs, rubs, or gallops,no edema  Respiratory:  clear to auscultation bilaterally, normal work of breathing GI: soft, nontender, nondistended, + BS MS: no deformity or atrophy Skin: warm and dry, no rash Neuro:  Strength and sensation are intact Psych: euthymic mood, full affect   EKG:  The ekg ordered today demonstrates NSR, rate of 62 bpm.    Recent Labs: No results found for requested labs within last 365 days.    Lipid Panel No results found for: CHOL, TRIG, HDL, CHOLHDL, VLDL, LDLCALC, LDLDIRECT    Wt Readings from Last 3 Encounters:  11/12/15 203 lb (92.08 kg)  11/02/15 202 lb 3.2 oz (91.717 kg)  08/14/14 200 lb (90.719 kg)     ASSESSMENT AND PLAN:  1. CAD: Hx of CABG in 2010, 5 vessel. (left internal mammary artery to the left anterior descending, right internal mammary artery to the distal right coronary artery, saphenous vein graft to the first diagonal, sequential saphenous vein graft to the first and second  obtuse marginals). Due to history and recent complaints of fatigue, stress test was completed. This was abnormal with intermediate risk, There is a defect present in the mid anterior, mid inferoseptal and mid inferior location. This is suggestive of a mild degree of ischemia.  I have discussed the possibility of having cardiac cath to evaluate patency grafts. He would like to wait until after the first of the year to have this done as he is feeling better and has a lot of family stuff to do over the holidays. I have asked him to refill   He will have appointment with Dr. Bronson Ing the first part of  January. They will discuss need for cath further. If he changes his mind he will call us. Continue current medication regimen. We have discussed not to be out in prolonged cold weather.    Current medicines are reviewed at length with the patient today.    Labs/ tests ordered today include:None  Orders Placed This Encounter  Procedures  . EKG 12-Lead     Disposition:   FU with Dr. Bronson Ing one mone.    Signed, Jory Sims, NP  11/12/2015 1:22 PM    [facility] [address], [place], [place] [address] Phone: [phone]; Fax: [phone]

## 2015-11-15 ENCOUNTER — Encounter: Payer: Self-pay | Admitting: Cardiovascular Disease

## 2015-11-15 ENCOUNTER — Ambulatory Visit (INDEPENDENT_AMBULATORY_CARE_PROVIDER_SITE_OTHER): Payer: Medicare Other | Admitting: Cardiovascular Disease

## 2015-11-15 ENCOUNTER — Telehealth: Payer: Self-pay | Admitting: Adult Health

## 2015-11-15 VITALS — BP 136/76 | HR 70 | Ht 72.0 in | Wt 203.0 lb

## 2015-11-15 DIAGNOSIS — I25810 Atherosclerosis of coronary artery bypass graft(s) without angina pectoris: Secondary | ICD-10-CM | POA: Diagnosis not present

## 2015-11-15 DIAGNOSIS — E7800 Pure hypercholesterolemia, unspecified: Secondary | ICD-10-CM

## 2015-11-15 DIAGNOSIS — R931 Abnormal findings on diagnostic imaging of heart and coronary circulation: Secondary | ICD-10-CM

## 2015-11-15 DIAGNOSIS — I10 Essential (primary) hypertension: Secondary | ICD-10-CM

## 2015-11-15 DIAGNOSIS — I2510 Atherosclerotic heart disease of native coronary artery without angina pectoris: Secondary | ICD-10-CM | POA: Diagnosis not present

## 2015-11-15 NOTE — Patient Instructions (Signed)
Your physician wants you to follow-up in: 4 months with Dr Koneswaran You will receive a reminder letter in the mail two months in advance. If you don't receive a letter, please call our office to schedule the follow-up appointment.   Your physician recommends that you continue on your current medications as directed. Please refer to the Current Medication list given to you today.   If you need a refill on your cardiac medications before your next appointment, please call your pharmacy.    Thank you for choosing [facility] !        

## 2015-11-15 NOTE — Telephone Encounter (Signed)
Apt made for pt to discuss cath with Dr Bronson Ing today

## 2015-11-15 NOTE — Progress Notes (Signed)
Patient ID: [name] [name], male   DOB: 1941-06-06, 74 y.o.   MRN: [id number]      SUBJECTIVE: The patient presents for follow-up with me for the first time since September 2015. He has seen K. Lawrence NP in the interim. He has a history of CABG on 02/12/2009, hypertension, and hypercholesterolemia.   Of note, he denied any symptoms of chest pain, shortness of breath or fatigue prior to bypass surgery. According to him, a stress test was performed which showed abnormalities which led to his coronary angiogram. He was still able to walk 300 feet without any symptoms.   A stress test was ordered by K. Lawrence NP when she evaluated him in the office on 11/02/15. He had been struggling with elevated blood pressures which was unusual for him.   Nuclear stress test on 11/10/15 demonstrated a hypertensive response to exercise. It also demonstrated a mild degree other ischemia in the anterior, inferior, and inferoseptal walls. It was deemed a low to intermediate risk study. Calculated LVEF 53% with inferoseptal hypokinesis.  When he was again evaluated on 11/12/15, he was feeling better. A cath was discussed but at that time he wanted to postpone. He now returns today to discuss this further.  He denies chest pain, fatigue, palpitations, and shortness of breath. His blood pressure is now better controlled. He has a lot of stressors at home related to his brother who is bipolar and helping to take care of 3 grandchildren, 2 of whom are 7 and one is 4. He said he otherwise feels well. He gets peace when he goes to church.      Review of Systems: As per "subjective", otherwise negative.  Allergies  Allergen Reactions  . Morphine     REACTION: dizziness    Current Outpatient Prescriptions  Medication Sig Dispense Refill  . aspirin 81 MG tablet Take 81 mg by mouth daily. 81 mg X2 days then 325 mg one day then 81 mg    . Cholecalciferol (VITAMIN D3) 1000 UNITS CAPS Take 1 tablet by mouth 2 (two)  times daily.     . fluticasone (FLONASE) 50 MCG/ACT nasal spray Place 2 sprays into the nose as needed.     . Glucosamine HCl 1000 MG TABS Take 2 tablets by mouth daily.      Marland Kitchen HYDROcodone-acetaminophen (VICODIN) 5-500 MG per tablet Take 1 tablet by mouth every 6 (six) hours as needed.      Marland Kitchen lisinopril (PRINIVIL,ZESTRIL) 5 MG tablet Take 1 tablet (5 mg total) by mouth daily. 90 tablet 3  . Multiple Vitamin (MULTIVITAMIN) tablet Take 1 tablet by mouth daily.      . nitroGLYCERIN (NITROSTAT) 0.4 MG SL tablet Place 1 tablet (0.4 mg total) under the tongue every 5 (five) minutes as needed. 25 tablet 3  . Omega-3 Fatty Acids (FISH OIL) 1200 MG CAPS Take 1 capsule by mouth daily.      Marland Kitchen oxyCODONE-acetaminophen (PERCOCET) 5-325 MG per tablet Take 1 tablet by mouth every 4 (four) hours as needed.      . pantoprazole (PROTONIX) 40 MG tablet Take 40 mg by mouth daily.      . rosuvastatin (CRESTOR) 20 MG tablet Take 20 mg by mouth daily.     No current facility-administered medications for this visit.    Past Medical History  Diagnosis Date  . CAD (coronary artery disease) of artery bypass graft     1.Status post coronary artery bypass grafting. 2. Normal left ventricular function. 3.  Increase left atrial dilatation.  . OA (osteoarthritis) of knee     Past Surgical History  Procedure Laterality Date  . Coronary artery bypass graft    . Knee arthroscopy      Social History   Social History  . Marital Status: Married    Spouse Name: N/A  . Number of Children: N/A  . Years of Education: N/A   Occupational History  . Not on file.   Social History Main Topics  . Smoking status: Never Smoker   . Smokeless tobacco: Never Used  . Alcohol Use: No  . Drug Use: No  . Sexual Activity: Not on file   Other Topics Concern  . Not on file   Social History Narrative     Filed Vitals:   11/15/15 0910  BP: 136/76  Pulse: 70  Height: 6' (1.829 m)  Weight: 203 lb (92.08 kg)  SpO2: 96%     PHYSICAL EXAM General: NAD HEENT: Normal. Neck: No JVD, no thyromegaly. Lungs: Clear to auscultation bilaterally with normal respiratory effort. CV: Nondisplaced PMI.  Regular rate and rhythm, normal S1/S2, no S3/S4, no murmur. No pretibial or periankle edema.  No carotid bruit.   Abdomen: Soft, nontender, no distention.  Neurologic: Alert and oriented x 3.  Psych: Normal affect. Skin: Normal. Musculoskeletal: No gross deformities. Extremities: No clubbing or cyanosis.   ECG: Most recent ECG reviewed.      ASSESSMENT AND PLAN: 1. CAD/CABG: Stable ischemic heart disease. Will manage medically in spite of low to intermediate risk stress test findings. Continue ASA and Crestor.  2. Hypercholesterolemia: Lipids on 10/11/15 showed TC 133, TG 52, HDL 46, LDL 77. Continue Crestor 20 mg daily.  3. Essential HTN: Controlled on lisinopril 5 mg. No changes.  Dispo: f/u 4 months.   Kate Sable, M.D., F.A.C.C.

## 2015-11-15 NOTE — Telephone Encounter (Signed)
Pt stated he would like to go ahead and schedule his Cath, he would also like someone to call him and talk w/ him about the procedure

## 2015-12-02 ENCOUNTER — Ambulatory Visit: Payer: PRIVATE HEALTH INSURANCE | Admitting: Adult Health

## 2015-12-13 ENCOUNTER — Ambulatory Visit: Payer: PRIVATE HEALTH INSURANCE | Admitting: Cardiovascular Disease

## 2016-02-19 DIAGNOSIS — L570 Actinic keratosis: Secondary | ICD-10-CM | POA: Diagnosis not present

## 2016-02-19 DIAGNOSIS — I10 Essential (primary) hypertension: Secondary | ICD-10-CM | POA: Diagnosis not present

## 2016-04-14 DIAGNOSIS — I10 Essential (primary) hypertension: Secondary | ICD-10-CM | POA: Diagnosis not present

## 2016-04-14 DIAGNOSIS — E782 Mixed hyperlipidemia: Secondary | ICD-10-CM | POA: Diagnosis not present

## 2016-04-14 DIAGNOSIS — K219 Gastro-esophageal reflux disease without esophagitis: Secondary | ICD-10-CM | POA: Diagnosis not present

## 2016-04-19 DIAGNOSIS — I25111 Atherosclerotic heart disease of native coronary artery with angina pectoris with documented spasm: Secondary | ICD-10-CM | POA: Diagnosis not present

## 2016-04-19 DIAGNOSIS — J301 Allergic rhinitis due to pollen: Secondary | ICD-10-CM | POA: Diagnosis not present

## 2016-04-19 DIAGNOSIS — K219 Gastro-esophageal reflux disease without esophagitis: Secondary | ICD-10-CM | POA: Diagnosis not present

## 2016-04-19 DIAGNOSIS — M1711 Unilateral primary osteoarthritis, right knee: Secondary | ICD-10-CM | POA: Diagnosis not present

## 2016-04-19 DIAGNOSIS — E782 Mixed hyperlipidemia: Secondary | ICD-10-CM | POA: Diagnosis not present

## 2016-04-19 DIAGNOSIS — K429 Umbilical hernia without obstruction or gangrene: Secondary | ICD-10-CM | POA: Diagnosis not present

## 2016-04-19 DIAGNOSIS — I10 Essential (primary) hypertension: Secondary | ICD-10-CM | POA: Diagnosis not present

## 2016-05-31 ENCOUNTER — Encounter: Payer: Self-pay | Admitting: Cardiovascular Disease

## 2016-05-31 ENCOUNTER — Ambulatory Visit (INDEPENDENT_AMBULATORY_CARE_PROVIDER_SITE_OTHER): Payer: Medicare Other | Admitting: Cardiovascular Disease

## 2016-05-31 VITALS — BP 132/70 | HR 62 | Ht 72.0 in | Wt 206.0 lb

## 2016-05-31 DIAGNOSIS — R931 Abnormal findings on diagnostic imaging of heart and coronary circulation: Secondary | ICD-10-CM | POA: Diagnosis not present

## 2016-05-31 DIAGNOSIS — I25810 Atherosclerosis of coronary artery bypass graft(s) without angina pectoris: Secondary | ICD-10-CM | POA: Diagnosis not present

## 2016-05-31 DIAGNOSIS — E7800 Pure hypercholesterolemia, unspecified: Secondary | ICD-10-CM | POA: Diagnosis not present

## 2016-05-31 DIAGNOSIS — I10 Essential (primary) hypertension: Secondary | ICD-10-CM | POA: Diagnosis not present

## 2016-05-31 NOTE — Progress Notes (Signed)
Patient ID: CLAUDEL BELKA, male   DOB: [birth year], 75 y.o.   MRN: [id number]      SUBJECTIVE: The patient presents for follow-up of coronary artery disease and CABG.  Nuclear stress test on 11/10/15 demonstrated a hypertensive response to exercise. It also demonstrated a mild degree other ischemia in the anterior, inferior, and inferoseptal walls. It was deemed a low to intermediate risk study. Calculated LVEF 53% with inferoseptal hypokinesis.  He preferred to avoid cath.  He denies chest pain, fatigue, and palpitations.   He only get short of breath if he has to walk up a steep incline. He works out at [employer] 2-3 days per week. He checks his blood pressure at his PCPs office and at a local pharmacy as well as at home and it has been normal.  A review of labs performed on 04/14/16 showed BUN 18, creatinine 0.96, total cholesterol 130, triglycerides 68, HDL 45, LDL 71.  He again told me about the multiple stressors he has at home.   Review of Systems: As per "subjective", otherwise negative.  Allergies  Allergen Reactions  . Morphine     REACTION: dizziness    Current Outpatient Prescriptions  Medication Sig Dispense Refill  . aspirin 81 MG tablet Take 81 mg by mouth daily. 81 mg X2 days then 325 mg one day then 81 mg    . Cholecalciferol (VITAMIN D3) 1000 UNITS CAPS Take 1 tablet by mouth 2 (two) times daily.     . fluticasone (FLONASE) 50 MCG/ACT nasal spray Place 2 sprays into the nose as needed.     . Glucosamine HCl 1000 MG TABS Take 2 tablets by mouth daily.      Marland Kitchen HYDROcodone-acetaminophen (VICODIN) 5-500 MG per tablet Take 1 tablet by mouth every 6 (six) hours as needed.      Marland Kitchen lisinopril (PRINIVIL,ZESTRIL) 20 MG tablet Take 20 mg by mouth daily.    . Multiple Vitamin (MULTIVITAMIN) tablet Take 1 tablet by mouth daily.      . nitroGLYCERIN (NITROSTAT) 0.4 MG SL tablet Place 1 tablet (0.4 mg total) under the tongue every 5 (five) minutes as needed. 25 tablet 3  . Omega-3  Fatty Acids (FISH OIL) 1200 MG CAPS Take 1 capsule by mouth daily.      Marland Kitchen oxyCODONE-acetaminophen (PERCOCET) 5-325 MG per tablet Take 1 tablet by mouth every 4 (four) hours as needed.      . pantoprazole (PROTONIX) 40 MG tablet Take 40 mg by mouth daily.      . rosuvastatin (CRESTOR) 20 MG tablet Take 20 mg by mouth daily.     No current facility-administered medications for this visit.    Past Medical History  Diagnosis Date  . CAD (coronary artery disease) of artery bypass graft     1.Status post coronary artery bypass grafting. 2. Normal left ventricular function. 3. Increase left atrial dilatation.  . OA (osteoarthritis) of knee     Past Surgical History  Procedure Laterality Date  . Coronary artery bypass graft    . Knee arthroscopy      Social History   Social History  . Marital Status: Married    Spouse Name: N/A  . Number of Children: N/A  . Years of Education: N/A   Occupational History  . Not on file.   Social History Main Topics  . Smoking status: Never Smoker   . Smokeless tobacco: Never Used  . Alcohol Use: No  . Drug Use: No  . Sexual  Activity: Not on file   Other Topics Concern  . Not on file   Social History Narrative     Filed Vitals:   05/31/16 1331  BP: 132/70  Pulse: 62  Height: 6' (1.829 m)  Weight: 206 lb (93.441 kg)  SpO2: 95%    PHYSICAL EXAM General: NAD HEENT: Normal. Neck: No JVD, no thyromegaly. Lungs: Clear to auscultation bilaterally with normal respiratory effort. CV: Nondisplaced PMI.  Regular rate and rhythm, normal S1/S2, no S3/S4, no murmur. No pretibial or periankle edema.  No carotid bruit.   Abdomen: Soft, nontender, no distention.  Neurologic: Alert and oriented.  Psych: Normal affect. Skin: Normal. Musculoskeletal: No gross deformities.    ECG: Most recent ECG reviewed.      ASSESSMENT AND PLAN: 1. CAD/CABG: Stable ischemic heart disease. Will manage medically in spite of low to intermediate risk  stress test findings. Continue ASA and Crestor.  2. Hypercholesterolemia: Lipids reviewed above. Continue Crestor 20 mg daily.  3. Essential HTN: Controlled on lisinopril 20 mg. No changes.  Dispo: f/u 6 months.   Kate Sable, M.D., F.A.C.C.

## 2016-05-31 NOTE — Patient Instructions (Signed)
Your physician wants you to follow-up in: 6 months with Dr. Koneswaran. You will receive a reminder letter in the mail two months in advance. If you don't receive a letter, please call our office to schedule the follow-up appointment.  Your physician recommends that you continue on your current medications as directed. Please refer to the Current Medication list given to you today.  Thank you for choosing [facility]!   

## 2016-08-30 DIAGNOSIS — E782 Mixed hyperlipidemia: Secondary | ICD-10-CM | POA: Diagnosis not present

## 2016-08-30 DIAGNOSIS — I10 Essential (primary) hypertension: Secondary | ICD-10-CM | POA: Diagnosis not present

## 2016-08-30 DIAGNOSIS — K219 Gastro-esophageal reflux disease without esophagitis: Secondary | ICD-10-CM | POA: Diagnosis not present

## 2016-08-30 DIAGNOSIS — I25111 Atherosclerotic heart disease of native coronary artery with angina pectoris with documented spasm: Secondary | ICD-10-CM | POA: Diagnosis not present

## 2016-09-06 DIAGNOSIS — K429 Umbilical hernia without obstruction or gangrene: Secondary | ICD-10-CM | POA: Diagnosis not present

## 2016-09-06 DIAGNOSIS — K219 Gastro-esophageal reflux disease without esophagitis: Secondary | ICD-10-CM | POA: Diagnosis not present

## 2016-09-06 DIAGNOSIS — M1711 Unilateral primary osteoarthritis, right knee: Secondary | ICD-10-CM | POA: Diagnosis not present

## 2016-09-06 DIAGNOSIS — I10 Essential (primary) hypertension: Secondary | ICD-10-CM | POA: Diagnosis not present

## 2016-09-06 DIAGNOSIS — Z6827 Body mass index (BMI) 27.0-27.9, adult: Secondary | ICD-10-CM | POA: Diagnosis not present

## 2016-09-06 DIAGNOSIS — E782 Mixed hyperlipidemia: Secondary | ICD-10-CM | POA: Diagnosis not present

## 2016-09-06 DIAGNOSIS — J301 Allergic rhinitis due to pollen: Secondary | ICD-10-CM | POA: Diagnosis not present

## 2016-09-06 DIAGNOSIS — I25111 Atherosclerotic heart disease of native coronary artery with angina pectoris with documented spasm: Secondary | ICD-10-CM | POA: Diagnosis not present

## 2017-03-16 DIAGNOSIS — D529 Folate deficiency anemia, unspecified: Secondary | ICD-10-CM | POA: Diagnosis not present

## 2017-03-16 DIAGNOSIS — D509 Iron deficiency anemia, unspecified: Secondary | ICD-10-CM | POA: Diagnosis not present

## 2017-03-16 DIAGNOSIS — I25111 Atherosclerotic heart disease of native coronary artery with angina pectoris with documented spasm: Secondary | ICD-10-CM | POA: Diagnosis not present

## 2017-03-16 DIAGNOSIS — D649 Anemia, unspecified: Secondary | ICD-10-CM | POA: Diagnosis not present

## 2017-03-16 DIAGNOSIS — I10 Essential (primary) hypertension: Secondary | ICD-10-CM | POA: Diagnosis not present

## 2017-03-16 DIAGNOSIS — K219 Gastro-esophageal reflux disease without esophagitis: Secondary | ICD-10-CM | POA: Diagnosis not present

## 2017-03-16 DIAGNOSIS — E782 Mixed hyperlipidemia: Secondary | ICD-10-CM | POA: Diagnosis not present

## 2017-03-16 DIAGNOSIS — Z9189 Other specified personal risk factors, not elsewhere classified: Secondary | ICD-10-CM | POA: Diagnosis not present

## 2017-03-16 DIAGNOSIS — D519 Vitamin B12 deficiency anemia, unspecified: Secondary | ICD-10-CM | POA: Diagnosis not present

## 2017-03-21 ENCOUNTER — Encounter (INDEPENDENT_AMBULATORY_CARE_PROVIDER_SITE_OTHER): Payer: Self-pay | Admitting: *Deleted

## 2017-03-21 DIAGNOSIS — I10 Essential (primary) hypertension: Secondary | ICD-10-CM | POA: Diagnosis not present

## 2017-03-21 DIAGNOSIS — Z6827 Body mass index (BMI) 27.0-27.9, adult: Secondary | ICD-10-CM | POA: Diagnosis not present

## 2017-03-21 DIAGNOSIS — E782 Mixed hyperlipidemia: Secondary | ICD-10-CM | POA: Diagnosis not present

## 2017-03-21 DIAGNOSIS — M1711 Unilateral primary osteoarthritis, right knee: Secondary | ICD-10-CM | POA: Diagnosis not present

## 2017-03-21 DIAGNOSIS — K429 Umbilical hernia without obstruction or gangrene: Secondary | ICD-10-CM | POA: Diagnosis not present

## 2017-03-21 DIAGNOSIS — K219 Gastro-esophageal reflux disease without esophagitis: Secondary | ICD-10-CM | POA: Diagnosis not present

## 2017-03-21 DIAGNOSIS — J301 Allergic rhinitis due to pollen: Secondary | ICD-10-CM | POA: Diagnosis not present

## 2017-03-21 DIAGNOSIS — I25111 Atherosclerotic heart disease of native coronary artery with angina pectoris with documented spasm: Secondary | ICD-10-CM | POA: Diagnosis not present

## 2017-03-29 ENCOUNTER — Encounter (INDEPENDENT_AMBULATORY_CARE_PROVIDER_SITE_OTHER): Payer: Self-pay | Admitting: *Deleted

## 2017-03-29 ENCOUNTER — Encounter (INDEPENDENT_AMBULATORY_CARE_PROVIDER_SITE_OTHER): Payer: Self-pay

## 2017-03-29 ENCOUNTER — Other Ambulatory Visit (INDEPENDENT_AMBULATORY_CARE_PROVIDER_SITE_OTHER): Payer: Self-pay | Admitting: *Deleted

## 2017-03-29 DIAGNOSIS — Z860100 Personal history of colon polyps, unspecified: Secondary | ICD-10-CM | POA: Insufficient documentation

## 2017-05-10 ENCOUNTER — Ambulatory Visit (INDEPENDENT_AMBULATORY_CARE_PROVIDER_SITE_OTHER): Payer: Medicare Other | Admitting: Cardiovascular Disease

## 2017-05-10 ENCOUNTER — Encounter: Payer: Self-pay | Admitting: Cardiovascular Disease

## 2017-05-10 VITALS — BP 122/70 | HR 64 | Ht 72.0 in | Wt 206.0 lb

## 2017-05-10 DIAGNOSIS — E7800 Pure hypercholesterolemia, unspecified: Secondary | ICD-10-CM | POA: Diagnosis not present

## 2017-05-10 DIAGNOSIS — R931 Abnormal findings on diagnostic imaging of heart and coronary circulation: Secondary | ICD-10-CM | POA: Diagnosis not present

## 2017-05-10 DIAGNOSIS — I25708 Atherosclerosis of coronary artery bypass graft(s), unspecified, with other forms of angina pectoris: Secondary | ICD-10-CM

## 2017-05-10 DIAGNOSIS — I10 Essential (primary) hypertension: Secondary | ICD-10-CM | POA: Diagnosis not present

## 2017-05-10 NOTE — Patient Instructions (Signed)
Your physician wants you to follow-up in: 1 year You will receive a reminder letter in the mail two months in advance. If you don't receive a letter, please call our office to schedule the follow-up appointment.   Your physician recommends that you continue on your current medications as directed. Please refer to the Current Medication list given to you today.    If you need a refill on your cardiac medications before your next appointment, please call your pharmacy.      Thank you for choosing [facility] !        

## 2017-05-10 NOTE — Progress Notes (Signed)
SUBJECTIVE: The patient presents for follow-up of coronary artery disease and CABG.  Nuclear stress test on 11/10/15 demonstrated a hypertensive response to exercise. It also demonstrated a mild degree other ischemia in the anterior, inferior, and inferoseptal walls. It was deemed a low to intermediate risk study. Calculated LVEF 53% with inferoseptal hypokinesis.  He preferred to avoid cath.  ECG performed in the office today which I ordered an personally interpreted demonstrated sinus rhythm with an incomplete right bundle-branch block.  The patient denies any symptoms of chest pain, palpitations, shortness of breath, lightheadedness, dizziness, leg swelling, orthopnea, PND, and syncope.  Lipids 03/16/17: Total cholesterol 132, triglycerides 52, HDL 44, LDL 78.  He continues to exercise at the [facility] 2 or 3 days a week and mows the lawn. He also takes care of 3 grandchildren.   Review of Systems: As per "subjective", otherwise negative.  Allergies  Allergen Reactions  . Morphine     REACTION: dizziness    Current Outpatient Prescriptions  Medication Sig Dispense Refill  . aspirin 81 MG tablet Take 81 mg by mouth daily. 81 mg X2 days then 325 mg one day then 81 mg    . fluticasone (FLONASE) 50 MCG/ACT nasal spray Place 2 sprays into the nose as needed.     Marland Kitchen lisinopril (PRINIVIL,ZESTRIL) 20 MG tablet Take 20 mg by mouth daily.    . Multiple Vitamin (MULTIVITAMIN) tablet Take 1 tablet by mouth daily.      . nitroGLYCERIN (NITROSTAT) 0.4 MG SL tablet Place 1 tablet (0.4 mg total) under the tongue every 5 (five) minutes as needed. 25 tablet 3  . oxyCODONE-acetaminophen (PERCOCET) 5-325 MG per tablet Take 1 tablet by mouth every 4 (four) hours as needed.      . pantoprazole (PROTONIX) 40 MG tablet Take 40 mg by mouth daily.      . rosuvastatin (CRESTOR) 20 MG tablet Take 20 mg by mouth daily.     No current facility-administered medications for this visit.     Past Medical  History:  Diagnosis Date  . CAD (coronary artery disease) of artery bypass graft    1.Status post coronary artery bypass grafting. 2. Normal left ventricular function. 3. Increase left atrial dilatation.  . OA (osteoarthritis) of knee     Past Surgical History:  Procedure Laterality Date  . CORONARY ARTERY BYPASS GRAFT    . KNEE ARTHROSCOPY      Social History   Social History  . Marital status: Married    Spouse name: N/A  . Number of children: N/A  . Years of education: N/A   Occupational History  . Not on file.   Social History Main Topics  . Smoking status: Never Smoker  . Smokeless tobacco: Never Used  . Alcohol use No  . Drug use: No  . Sexual activity: Not on file   Other Topics Concern  . Not on file   Social History Narrative  . No narrative on file     Vitals:   05/10/17 1135  BP: 122/70  Pulse: 64  SpO2: 95%  Weight: 206 lb (93.4 kg)  Height: 6' (1.829 m)    Wt Readings from Last 3 Encounters:  05/10/17 206 lb (93.4 kg)  05/31/16 206 lb (93.4 kg)  11/15/15 203 lb (92.1 kg)     PHYSICAL EXAM General: NAD HEENT: Normal. Neck: No JVD, no thyromegaly. Lungs: Clear to auscultation bilaterally with normal respiratory effort. CV: Nondisplaced PMI.  Regular rate and  rhythm, normal S1/S2, no S3/S4, no murmur. No pretibial or periankle edema.  No carotid bruit.   Abdomen: Soft, nontender, no distention.  Neurologic: Alert and oriented.  Psych: Normal affect. Skin: Normal. Musculoskeletal: No gross deformities.    ECG: Most recent ECG reviewed.   Labs: Lab Results  Component Value Date/Time   K 4.1 DELTA CHECK NOTED 04/06/2011 04:20 AM   BUN 7 04/06/2011 04:20 AM   CREATININE 0.66 04/06/2011 04:20 AM   ALT 23 03/30/2011 03:40 PM   HGB 11.1 (L) 04/06/2011 04:20 AM     Lipids: No results found for: LDLCALC, LDLDIRECT, CHOL, TRIG, HDL     ASSESSMENT AND PLAN: 1. CAD/CABG: Symptomatically stable. Continue aspirin and Crestor.  2.  Hypercholesterolemia: Lipids reviewed above. Well controlled. Continue Crestor.  3. Essential HTN: Controlled. No changes to therapy.    Disposition: Follow up 1 yr  Kate Sable, M.D., F.A.C.C.

## 2017-06-22 ENCOUNTER — Telehealth (INDEPENDENT_AMBULATORY_CARE_PROVIDER_SITE_OTHER): Payer: Self-pay | Admitting: *Deleted

## 2017-06-22 ENCOUNTER — Encounter (INDEPENDENT_AMBULATORY_CARE_PROVIDER_SITE_OTHER): Payer: Self-pay | Admitting: *Deleted

## 2017-06-22 MED ORDER — PEG 3350-KCL-NA BICARB-NACL 420 G PO SOLR: 4000.0000 mL | Freq: Once | ORAL | 0 refills | Status: AC

## 2017-06-22 NOTE — Telephone Encounter (Signed)
Patient needs trilyte 

## 2017-06-29 ENCOUNTER — Telehealth (INDEPENDENT_AMBULATORY_CARE_PROVIDER_SITE_OTHER): Payer: Self-pay | Admitting: *Deleted

## 2017-06-29 NOTE — Telephone Encounter (Signed)
Referring MD/PCP: daniel   Procedure: tcs  Reason/Indication:  Hx polyps  Has patient had this procedure before?  Yes, 2012 & 2007 -- scanned  If so, when, by whom and where?    Is there a family history of colon cancer?  no  Who?  What age when diagnosed?    Is patient diabetic?   no      Does patient have prosthetic heart valve or mechanical valve?  no  Do you have a pacemaker?  no  Has patient ever had endocarditis? no  Has patient had joint replacement within last 12 months?  no  Does patient tend to be constipated or take laxatives? no  Does patient have a history of alcohol/drug use?  no  Is patient on Coumadin, Plavix and/or Aspirin? yes  Medications: see epic  Allergies: see epic  Medication Adjustment per Dr Laural Golden: asa 2 days  Procedure date & time: 08/03/17 at 43

## 2017-07-03 NOTE — Telephone Encounter (Signed)
agree

## 2017-08-03 ENCOUNTER — Ambulatory Visit (HOSPITAL_COMMUNITY)
Admission: RE | Admit: 2017-08-03 | Discharge: 2017-08-03 | Disposition: A | Discharge status: Home / Self Care | Payer: Medicare Other | Source: Ambulatory Visit | Attending: Internal Medicine | Admitting: Internal Medicine

## 2017-08-03 ENCOUNTER — Encounter (HOSPITAL_COMMUNITY)
Admission: RE | Disposition: A | Discharge status: Home / Self Care | Payer: Self-pay | Source: Ambulatory Visit | Attending: Internal Medicine

## 2017-08-03 ENCOUNTER — Encounter (HOSPITAL_COMMUNITY): Payer: Self-pay | Admitting: *Deleted

## 2017-08-03 DIAGNOSIS — Z79891 Long term (current) use of opiate analgesic: Secondary | ICD-10-CM | POA: Diagnosis not present

## 2017-08-03 DIAGNOSIS — Z86010 Personal history of colonic polyps: Secondary | ICD-10-CM | POA: Diagnosis not present

## 2017-08-03 DIAGNOSIS — K644 Residual hemorrhoidal skin tags: Secondary | ICD-10-CM | POA: Diagnosis not present

## 2017-08-03 DIAGNOSIS — I2510 Atherosclerotic heart disease of native coronary artery without angina pectoris: Secondary | ICD-10-CM | POA: Diagnosis not present

## 2017-08-03 DIAGNOSIS — Z8719 Personal history of other diseases of the digestive system: Secondary | ICD-10-CM | POA: Diagnosis not present

## 2017-08-03 DIAGNOSIS — Z79899 Other long term (current) drug therapy: Secondary | ICD-10-CM | POA: Insufficient documentation

## 2017-08-03 DIAGNOSIS — Q438 Other specified congenital malformations of intestine: Secondary | ICD-10-CM | POA: Diagnosis not present

## 2017-08-03 DIAGNOSIS — M1990 Unspecified osteoarthritis, unspecified site: Secondary | ICD-10-CM | POA: Diagnosis not present

## 2017-08-03 DIAGNOSIS — D122 Benign neoplasm of ascending colon: Secondary | ICD-10-CM | POA: Diagnosis not present

## 2017-08-03 DIAGNOSIS — D128 Benign neoplasm of rectum: Secondary | ICD-10-CM | POA: Diagnosis not present

## 2017-08-03 DIAGNOSIS — Z888 Allergy status to other drugs, medicaments and biological substances status: Secondary | ICD-10-CM | POA: Diagnosis not present

## 2017-08-03 DIAGNOSIS — K621 Rectal polyp: Secondary | ICD-10-CM | POA: Insufficient documentation

## 2017-08-03 DIAGNOSIS — Z09 Encounter for follow-up examination after completed treatment for conditions other than malignant neoplasm: Secondary | ICD-10-CM | POA: Diagnosis not present

## 2017-08-03 HISTORY — PX: COLONOSCOPY: SHX5424

## 2017-08-03 SURGERY — COLONOSCOPY
Anesthesia: Moderate Sedation

## 2017-08-03 MED ORDER — SODIUM CHLORIDE 0.9 % IV SOLN
INTRAVENOUS | Status: AC | PRN
  Administered 2017-08-03: 1000 mL via INTRAVENOUS

## 2017-08-03 MED ORDER — MIDAZOLAM HCL 5 MG/5ML IJ SOLN
INTRAMUSCULAR | Status: DC | PRN
Start: 2017-08-03 — End: 2017-08-03
  Administered 2017-08-03 (×4): 1 mg via INTRAVENOUS
  Administered 2017-08-03: 2 mg via INTRAVENOUS

## 2017-08-03 MED ORDER — MEPERIDINE HCL 50 MG/ML IJ SOLN
INTRAMUSCULAR | Status: AC
  Filled 2017-08-03: qty 1

## 2017-08-03 MED ORDER — SODIUM CHLORIDE 0.9% FLUSH
INTRAVENOUS | Status: AC
  Filled 2017-08-03: qty 10

## 2017-08-03 MED ORDER — MIDAZOLAM HCL 5 MG/5ML IJ SOLN
INTRAMUSCULAR | Status: AC
  Filled 2017-08-03: qty 10

## 2017-08-03 MED ORDER — MEPERIDINE HCL 50 MG/ML IJ SOLN
INTRAMUSCULAR | Status: DC | PRN
  Administered 2017-08-03 (×2): 25 mg via INTRAVENOUS

## 2017-08-03 MED ORDER — SODIUM CHLORIDE 0.9 % IV SOLN
INTRAVENOUS | Status: DC
  Administered 2017-08-03: 07:00:00 via INTRAVENOUS

## 2017-08-03 MED ORDER — SIMETHICONE 40 MG/0.6ML PO SUSP
ORAL | Status: DC | PRN
  Administered 2017-08-03: 08:00:00

## 2017-08-03 NOTE — Discharge Instructions (Signed)
No aspirin or NSAIDs for 1 week. Resume other medications and diet as before. No driving for 24 hours. Physician will call with biopsy results.   Colonoscopy, Adult, Care After This sheet gives you information about how to care for yourself after your procedure. Your health care provider may also give you more specific instructions. If you have problems or questions, contact your health care provider. What can I expect after the procedure? After the procedure, it is common to have:  A small amount of blood in your stool for 24 hours after the procedure.  Some gas.  Mild abdominal cramping or bloating.  Follow these instructions at home: General instructions   For the first 24 hours after the procedure: ? Do not drive or use machinery. ? Do not sign important documents. ? Do not drink alcohol. ? Do your regular daily activities at a slower pace than normal. ? Eat soft, easy-to-digest foods. ? Rest often.  Take over-the-counter or prescription medicines only as told by your health care provider.  It is up to you to get the results of your procedure. Ask your health care provider, or the department performing the procedure, when your results will be ready. Relieving cramping and bloating  Try walking around when you have cramps or feel bloated.  Apply heat to your abdomen as told by your health care provider. Use a heat source that your health care provider recommends, such as a moist heat pack or a heating pad. ? Place a towel between your skin and the heat source. ? Leave the heat on for 20-30 minutes. ? Remove the heat if your skin turns bright red. This is especially important if you are unable to feel pain, heat, or cold. You may have a greater risk of getting burned. Eating and drinking  Drink enough fluid to keep your urine clear or pale yellow.  Resume your normal diet as instructed by your health care provider. Avoid heavy or fried foods that are hard to  digest.  Avoid drinking alcohol for as long as instructed by your health care provider. Contact a health care provider if:  You have blood in your stool 2-3 days after the procedure. Get help right away if:  You have more than a small spotting of blood in your stool.  You pass large blood clots in your stool.  Your abdomen is swollen.  You have nausea or vomiting.  You have a fever.  You have increasing abdominal pain that is not relieved with medicine. This information is not intended to replace advice given to you by your health care provider. Make sure you discuss any questions you have with your health care provider. Document Released: 07/04/2004 Document Revised: 08/14/2016 Document Reviewed: 02/01/2016 Elsevier Interactive Patient Education  2018 [employer].   Colon Polyps Polyps are tissue growths inside the body. Polyps can grow in many places, including the large intestine (colon). A polyp may be a round bump or a mushroom-shaped growth. You could have one polyp or several. Most colon polyps are noncancerous (benign). However, some colon polyps can become cancerous over time. What are the causes? The exact cause of colon polyps is not known. What increases the risk? This condition is more likely to develop in people who:  Have a family history of colon cancer or colon polyps.  Are older than 51 or older than 45 if they are African American.  Have inflammatory bowel disease, such as ulcerative colitis or Crohn disease.  Are overweight.  Smoke cigarettes.  Do not get enough exercise.  Drink too much alcohol.  Eat a diet that is: ? High in fat and red meat. ? Low in fiber.  Had childhood cancer that was treated with abdominal radiation.  What are the signs or symptoms? Most polyps do not cause symptoms. If you have symptoms, they may include:  Blood coming from your rectum when having a bowel movement.  Blood in your stool.The stool may look dark red  or black.  A change in bowel habits, such as constipation or diarrhea.  How is this diagnosed? This condition is diagnosed with a colonoscopy. This is a procedure that uses a lighted, flexible scope to look at the inside of your colon. How is this treated? Treatment for this condition involves removing any polyps that are found. Those polyps will then be tested for cancer. If cancer is found, your health care provider will talk to you about options for colon cancer treatment. Follow these instructions at home: Diet  Eat plenty of fiber, such as fruits, vegetables, and whole grains.  Eat foods that are high in calcium and vitamin D, such as milk, cheese, yogurt, eggs, liver, fish, and broccoli.  Limit foods high in fat, red meats, and processed meats, such as hot dogs, sausage, bacon, and lunch meats.  Maintain a healthy weight, or lose weight if recommended by your health care provider. General instructions  Do not smoke cigarettes.  Do not drink alcohol excessively.  Keep all follow-up visits as told by your health care provider. This is important. This includes keeping regularly scheduled colonoscopies. Talk to your health care provider about when you need a colonoscopy.  Exercise every day or as told by your health care provider. Contact a health care provider if:  You have new or worsening bleeding during a bowel movement.  You have new or increased blood in your stool.  You have a change in bowel habits.  You unexpectedly lose weight. This information is not intended to replace advice given to you by your health care provider. Make sure you discuss any questions you have with your health care provider. Document Released: 08/16/2004 Document Revised: 04/27/2016 Document Reviewed: 10/11/2015 Elsevier Interactive Patient Education  [employer].

## 2017-08-03 NOTE — H&P (Signed)
[name] [name] is an 76 y.o. male.   Chief Complaint: Patient is here for colonoscopy HPI: patient is 76 year old Caucasian male with history of colonic adenomas and is here for surveillance colonoscopy He denies abdominal pain change in bowel habits or frank rectal bleeding. He has history of hemorrhoids and bleeds some in stool is hard. Family history is negative for CRC but hid brother has had colonic polyps.   Past Medical History:  Diagnosis Date  . CAD (coronary artery disease) of artery bypass graft    1.Status post coronary artery bypass grafting. 2. Normal left ventricular function. 3. Increase left atrial dilatation.  . OA (osteoarthritis) of knee     Past Surgical History:  Procedure Laterality Date  . CORONARY ARTERY BYPASS GRAFT    . JOINT REPLACEMENT Left 2012   Left knee  . KNEE ARTHROSCOPY      Family History  Problem Relation Age of Onset  . Coronary artery disease Unknown   . Coronary artery disease Mother   . Coronary artery disease Father   . Heart attack Father    Social History:  reports that he has never smoked. He has never used smokeless tobacco. He reports that he does not drink alcohol or use drugs.  Allergies:  Allergies  Allergen Reactions  . Morphine     REACTION: dizziness    Medications Prior to Admission  Medication Sig Dispense Refill  . aspirin 81 MG tablet Take 81 mg by mouth every other day.     . fluticasone (FLONASE) 50 MCG/ACT nasal spray Place 1 spray into the nose at bedtime.     Marland Kitchen lisinopril-hydrochlorothiazide (PRINZIDE,ZESTORETIC) 20-12.5 MG tablet Take 1 tablet by mouth daily.    . Multiple Vitamin (MULTIVITAMIN) tablet Take 1 tablet by mouth daily.      . nitroGLYCERIN (NITROSTAT) 0.4 MG SL tablet Place 1 tablet (0.4 mg total) under the tongue every 5 (five) minutes as needed. 25 tablet 3  . pantoprazole (PROTONIX) 40 MG tablet Take 40 mg by mouth daily.      . rosuvastatin (CRESTOR) 20 MG tablet Take 20 mg by mouth daily with  lunch.     . oxyCODONE-acetaminophen (PERCOCET) 5-325 MG per tablet Take 1 tablet by mouth every 4 (four) hours as needed for severe pain.       No results found for this or any previous visit (from the past 48 hour(s)). No results found.  ROS  Blood pressure 121/87, pulse 63, temperature 98.5 F (36.9 C), temperature source Oral, resp. rate 14, SpO2 99 %. Physical Exam  Constitutional: He appears well-developed and well-nourished.  HENT:  Mouth/Throat: Oropharynx is clear and moist.  Eyes: Conjunctivae are normal. No scleral icterus.  Neck: No thyromegaly present.  Cardiovascular: Normal rate, regular rhythm and normal heart sounds.   No murmur heard. Respiratory: Effort normal and breath sounds normal.  GI: Soft. He exhibits no distension and no mass. There is no tenderness.  Small umbilical hernia. It is soft and nontender.  Lymphadenopathy:    He has no cervical adenopathy.     Assessment/Plan History of colonic polyps. Surveillance colonoscopy.  Hildred Laser, MD 08/03/2017, 7:31 AM

## 2017-08-03 NOTE — Op Note (Signed)
[facility] Patient Name: [name] [name] Procedure Date: 08/03/2017 7:05 AM MRN: [id number] Date of Birth: [birth year] Attending MD: Hildred Laser , MD CSN: [id number] Age: 76 Admit Type: Outpatient Procedure:                Colonoscopy Indications:              High risk colon cancer surveillance: Personal                            history of colonic polyps Providers:                Hildred Laser, MD, Hinton Rao, RN, Lurline Del, RN Referring MD:             Mitzie Na. Quillian Quince, MD Medicines:                Meperidine 50 mg IV, Midazolam 6 mg IV Complications:            No immediate complications. Estimated Blood Loss:     Estimated blood loss was minimal. Procedure:                Pre-Anesthesia Assessment:                           - Prior to the procedure, a History and Physical                            was performed, and patient medications and                            allergies were reviewed. The patient's tolerance of                            previous anesthesia was also reviewed. The risks                            and benefits of the procedure and the sedation                            options and risks were discussed with the patient.                            All questions were answered, and informed consent                            was obtained. Prior Anticoagulants: The patient                            last took aspirin 7 days prior to the procedure.                            ASA Grade Assessment: II - A patient with mild                            systemic disease. After reviewing the risks and  benefits, the patient was deemed in satisfactory                            condition to undergo the procedure.                           After obtaining informed consent, the colonoscope                            was passed under direct vision. Throughout the                            procedure, the patient's blood pressure, pulse, and                             oxygen saturations were monitored continuously. The                            [id number]-[id number]) scope was introduced through the                            anus and advanced to the the cecum, identified by                            appendiceal orifice and ileocecal valve. The                            colonoscopy was technically difficult and complex                            due to a tortuous colon. Successful completion of                            the procedure was aided by increasing the dose of                            sedation medication, changing the patient's                            position, using manual pressure and withdrawing and                            reinserting the scope. The patient tolerated the                            procedure well. The quality of the bowel                            preparation was good. The ileocecal valve,                            appendiceal orifice, and rectum were photographed. Scope In: 7:41:59 AM Scope Out: 8:25:48 AM Scope Withdrawal Time: 0 hours 11 minutes 20 seconds  Total Procedure Duration:  0 hours 43 minutes 49 seconds  Findings:      The perianal exam findings include skin tags.      A small polyp was found in the ascending colon. The polyp was sessile.       Biopsies were taken with a cold forceps for histology. The pathology       specimen was placed into Bottle Number 1.      A 15 mm polyp was found in the rectum. The polyp was semi-pedunculated.       The polyp was removed with a piecemeal technique using a hot snare.       Resection and retrieval were complete. The pathology specimen was placed       into Bottle Number 2.      External hemorrhoids were found during retroflexion. The hemorrhoids       were small. Impression:               - Perianal skin tags found on perianal exam.                           - One small polyp in the ascending colon. Biopsied.                            - One 15 mm polyp in the rectum, removed piecemeal                            using a hot snare. Resected and retrieved.                           - External hemorrhoids. Moderate Sedation:      Moderate (conscious) sedation was administered by the endoscopy nurse       and supervised by the endoscopist. The following parameters were       monitored: oxygen saturation, heart rate, blood pressure, CO2       capnography and response to care. Total physician intraservice time was       49 minutes. Recommendation:           - Patient has a contact number available for                            emergencies. The signs and symptoms of potential                            delayed complications were discussed with the                            patient. Return to normal activities tomorrow.                            Written discharge instructions were provided to the                            patient.                           - Resume previous diet today.                           -  Continue present medications.                           - No aspirin, ibuprofen, naproxen, or other                            non-steroidal anti-inflammatory drugs for 7 days                            after polyp removal.                           - Await pathology results.                           - Repeat colonoscopy for surveillance based on                            pathology results. Procedure Code(s):        --- Professional ---                           [phone], Colonoscopy, flexible; with removal of                            tumor(s), polyp(s), or other lesion(s) by snare                            technique                           45380, 59, Colonoscopy, flexible; with biopsy,                            single or multiple                           99152, Moderate sedation services provided by the                            same physician or other qualified health care                             professional performing the diagnostic or                            therapeutic service that the sedation supports,                            requiring the presence of an independent trained                            observer to assist in the monitoring of the                            patient's level of consciousness and physiological  status; initial 15 minutes of intraservice time,                            patient age 71 years or older                           [phone], Moderate sedation services; each additional                            15 minutes intraservice time                           [phone], Moderate sedation services; each additional                            15 minutes intraservice time Diagnosis Code(s):        --- Professional ---                           K64.4, Residual hemorrhoidal skin tags                           Z86.010, Personal history of colonic polyps                           D12.2, Benign neoplasm of ascending colon                           K62.1, Rectal polyp CPT copyright 2016 [employer]. All rights reserved. The codes documented in this report are preliminary and upon coder review may  be revised to meet current compliance requirements. Hildred Laser, MD Hildred Laser, MD 08/03/2017 8:34:25 AM This report has been signed electronically. Number of Addenda: 0

## 2017-08-08 ENCOUNTER — Encounter (HOSPITAL_COMMUNITY): Payer: Self-pay | Admitting: Internal Medicine

## 2017-08-15 ENCOUNTER — Encounter (HOSPITAL_COMMUNITY)
Admission: EM | Disposition: A | Discharge status: Home / Self Care | Payer: Self-pay | Source: Home / Self Care | Attending: Emergency Medicine

## 2017-08-15 ENCOUNTER — Encounter (HOSPITAL_COMMUNITY): Payer: Self-pay | Admitting: Emergency Medicine

## 2017-08-15 ENCOUNTER — Ambulatory Visit (HOSPITAL_COMMUNITY)
Admission: EM | Admit: 2017-08-15 | Discharge: 2017-08-15 | Disposition: A | Discharge status: Home / Self Care | Payer: Medicare Other | Attending: Emergency Medicine | Admitting: Emergency Medicine

## 2017-08-15 ENCOUNTER — Telehealth (INDEPENDENT_AMBULATORY_CARE_PROVIDER_SITE_OTHER): Payer: Self-pay | Admitting: *Deleted

## 2017-08-15 DIAGNOSIS — K91840 Postprocedural hemorrhage and hematoma of a digestive system organ or structure following a digestive system procedure: Secondary | ICD-10-CM | POA: Insufficient documentation

## 2017-08-15 DIAGNOSIS — Z7982 Long term (current) use of aspirin: Secondary | ICD-10-CM | POA: Diagnosis not present

## 2017-08-15 DIAGNOSIS — Z79899 Other long term (current) drug therapy: Secondary | ICD-10-CM | POA: Diagnosis not present

## 2017-08-15 DIAGNOSIS — Z8719 Personal history of other diseases of the digestive system: Secondary | ICD-10-CM | POA: Diagnosis not present

## 2017-08-15 DIAGNOSIS — Z7951 Long term (current) use of inhaled steroids: Secondary | ICD-10-CM | POA: Diagnosis not present

## 2017-08-15 DIAGNOSIS — I25810 Atherosclerosis of coronary artery bypass graft(s) without angina pectoris: Secondary | ICD-10-CM | POA: Diagnosis not present

## 2017-08-15 DIAGNOSIS — Y838 Other surgical procedures as the cause of abnormal reaction of the patient, or of later complication, without mention of misadventure at the time of the procedure: Secondary | ICD-10-CM | POA: Diagnosis not present

## 2017-08-15 DIAGNOSIS — E785 Hyperlipidemia, unspecified: Secondary | ICD-10-CM | POA: Diagnosis not present

## 2017-08-15 DIAGNOSIS — K922 Gastrointestinal hemorrhage, unspecified: Secondary | ICD-10-CM | POA: Diagnosis not present

## 2017-08-15 DIAGNOSIS — K626 Ulcer of anus and rectum: Secondary | ICD-10-CM | POA: Insufficient documentation

## 2017-08-15 DIAGNOSIS — Z96652 Presence of left artificial knee joint: Secondary | ICD-10-CM | POA: Insufficient documentation

## 2017-08-15 DIAGNOSIS — Z86010 Personal history of colonic polyps: Secondary | ICD-10-CM | POA: Diagnosis not present

## 2017-08-15 HISTORY — PX: FLEXIBLE SIGMOIDOSCOPY: SHX5431

## 2017-08-15 LAB — CBC
HEMATOCRIT: 34.8 % — AB (ref 39.0–52.0)
HEMOGLOBIN: 11.8 g/dL — AB (ref 13.0–17.0)
MCH: 29.4 pg (ref 26.0–34.0)
MCHC: 33.9 g/dL (ref 30.0–36.0)
MCV: 86.6 fL (ref 78.0–100.0)
Platelets: 203 10*3/uL (ref 150–400)
RBC: 4.02 MIL/uL — AB (ref 4.22–5.81)
RDW: 14.8 % (ref 11.5–15.5)
WBC: 7.3 10*3/uL (ref 4.0–10.5)

## 2017-08-15 LAB — TYPE AND SCREEN
ABO/RH(D): O NEG
Antibody Screen: NEGATIVE

## 2017-08-15 LAB — COMPREHENSIVE METABOLIC PANEL
ALBUMIN: 3.9 g/dL (ref 3.5–5.0)
ALK PHOS: 66 U/L (ref 38–126)
ALT: 17 U/L (ref 17–63)
ANION GAP: 10 (ref 5–15)
AST: 21 U/L (ref 15–41)
BUN: 25 mg/dL — ABNORMAL HIGH (ref 6–20)
CHLORIDE: 101 mmol/L (ref 101–111)
CO2: 22 mmol/L (ref 22–32)
Calcium: 9.4 mg/dL (ref 8.9–10.3)
Creatinine, Ser: 1.31 mg/dL — ABNORMAL HIGH (ref 0.61–1.24)
GFR calc non Af Amer: 52 mL/min — ABNORMAL LOW (ref >60)
Glucose, Bld: 102 mg/dL — ABNORMAL HIGH (ref 65–99)
POTASSIUM: 3.6 mmol/L (ref 3.5–5.1)
SODIUM: 133 mmol/L — AB (ref 135–145)
Total Bilirubin: 0.4 mg/dL (ref 0.3–1.2)
Total Protein: 7.3 g/dL (ref 6.5–8.1)

## 2017-08-15 SURGERY — SIGMOIDOSCOPY, FLEXIBLE
Anesthesia: Moderate Sedation

## 2017-08-15 MED ORDER — MIDAZOLAM HCL 5 MG/5ML IJ SOLN
INTRAMUSCULAR | Status: DC | PRN
  Administered 2017-08-15: 2 mg via INTRAVENOUS
  Administered 2017-08-15: 1 mg via INTRAVENOUS
  Administered 2017-08-15: 2 mg via INTRAVENOUS

## 2017-08-15 MED ORDER — MIDAZOLAM HCL 5 MG/5ML IJ SOLN
INTRAMUSCULAR | Status: AC
  Filled 2017-08-15: qty 10

## 2017-08-15 MED ORDER — STERILE WATER FOR IRRIGATION IR SOLN
Status: DC | PRN
  Administered 2017-08-15: 2.5 mL

## 2017-08-15 MED ORDER — SODIUM CHLORIDE 0.9 % IV SOLN
INTRAVENOUS | Status: DC
  Administered 2017-08-15: 19:00:00 via INTRAVENOUS

## 2017-08-15 MED ORDER — MEPERIDINE HCL 25 MG/ML IJ SOLN
INTRAMUSCULAR | Status: DC | PRN
  Administered 2017-08-15 (×2): 25 mg via INTRAVENOUS

## 2017-08-15 MED ORDER — MEPERIDINE HCL 50 MG/ML IJ SOLN
INTRAMUSCULAR | Status: AC
  Filled 2017-08-15: qty 1

## 2017-08-15 MED ORDER — FLEET ENEMA 7-19 GM/118ML RE ENEM: 1.0000 | ENEMA | Freq: Once | RECTAL | Status: DC

## 2017-08-15 NOTE — ED Provider Notes (Signed)
[facility] DEPT Provider Note   CSN: [id number] Arrival date & time: 08/15/17  1710     History   Chief Complaint Chief Complaint  Patient presents with  . Abdominal Pain    HPI [name] [name] is a 76 y.o. male.  Patient with onset of rectal bleeding today approximately 2 hours prior to arrival. Patient had blood running down his leg and then had a large dominant with that was almost predominantly all red blood. With some normal-appearing stool mixed in. Patient status post colonoscopy 12 days ago with some polyps that were biopsied. Patient is followed by Dr. Dereck Leep from gastroenterology. Patient has some mild anterior suprapubic abdominal discomfort. No nausea no vomiting. No back pain. No lightheadedness not feeling like she's got pass out.      Past Medical History:  Diagnosis Date  . CAD (coronary artery disease) of artery bypass graft    1.Status post coronary artery bypass grafting. 2. Normal left ventricular function. 3. Increase left atrial dilatation.  . OA (osteoarthritis) of knee     Patient Active Problem List   Diagnosis Date Noted  . History of colonic polyps 03/29/2017  . DYSLIPIDEMIA 10/15/2009  . SINUS BRADYCARDIA 10/15/2009  . DEGENERATIVE JOINT DISEASE, KNEE 10/15/2009  . CORONARY ARTERY BYPASS GRAFT, HX OF 10/15/2009  . CAD, ARTERY BYPASS GRAFT 09/11/2009    Past Surgical History:  Procedure Laterality Date  . COLONOSCOPY N/A 08/03/2017   Procedure: COLONOSCOPY;  Surgeon: Rogene Houston, MD;  Location: [facility];  Service: Endoscopy;  Laterality: N/A;  730  . CORONARY ARTERY BYPASS GRAFT    . JOINT REPLACEMENT Left 2012   Left knee  . KNEE ARTHROSCOPY         Home Medications    Prior to Admission medications   Medication Sig Start Date End Date Taking? Authorizing Provider  aspirin 81 MG tablet Take 1 tablet (81 mg total) by mouth every other day. 08/10/17  Yes Rehman, Mechele Dawley, MD  fluticasone (FLONASE) 50 MCG/ACT nasal spray  Place 1 spray into the nose at bedtime.  03/22/12  Yes [provider]  lisinopril-hydrochlorothiazide (PRINZIDE,ZESTORETIC) 20-12.5 MG tablet Take 1 tablet by mouth daily.   Yes [provider]  loratadine (CLARITIN) 10 MG tablet Take 10 mg by mouth daily.   Yes [provider]  Multiple Vitamin (MULTIVITAMIN) tablet Take 1 tablet by mouth daily.     Yes [provider]  nitroGLYCERIN (NITROSTAT) 0.4 MG SL tablet Place 1 tablet (0.4 mg total) under the tongue every 5 (five) minutes as needed. 11/12/15  Yes Lendon Colonel, NP  pantoprazole (PROTONIX) 40 MG tablet Take 40 mg by mouth daily.     Yes [provider]  rosuvastatin (CRESTOR) 20 MG tablet Take 20 mg by mouth daily with lunch.    Yes [provider]  oxyCODONE-acetaminophen (PERCOCET) 5-325 MG per tablet Take 1 tablet by mouth every 4 (four) hours as needed for severe pain.     [provider]    Family History Family History  Problem Relation Age of Onset  . Coronary artery disease Unknown   . Coronary artery disease Mother   . Coronary artery disease Father   . Heart attack Father     Social History Social History  Substance Use Topics  . Smoking status: Never Smoker  . Smokeless tobacco: Never Used  . Alcohol use No     Allergies   Morphine   Review of Systems Review of  Systems  Constitutional: Negative for fever.  HENT: Negative for congestion.   Eyes: Negative for redness.  Respiratory: Negative for shortness of breath.   Cardiovascular: Negative for chest pain.  Gastrointestinal: Positive for abdominal pain and blood in stool. Negative for nausea and vomiting.  Genitourinary: Negative for dysuria.  Musculoskeletal: Negative for back pain.  Skin: Negative for rash.  Neurological: Negative for dizziness, syncope, weakness and light-headedness.  Hematological: Does not bruise/bleed easily.  Psychiatric/Behavioral: Negative for confusion.      Physical Exam Updated Vital Signs BP 110/66   Pulse 65   Temp 98.6 F (37 C) (Oral)   Resp 18   Ht 1.829 m (6')   Wt 90.7 kg (200 lb)   SpO2 98%   BMI 27.12 kg/m   Physical Exam  Constitutional: He is oriented to person, place, and time. He appears well-developed and well-nourished. No distress.  HENT:  Head: Normocephalic and atraumatic.  Mouth/Throat: Oropharynx is clear and moist.  Eyes: Pupils are equal, round, and reactive to light. Conjunctivae and EOM are normal.  Neck: Normal range of motion. Neck supple.  Cardiovascular: Normal rate, regular rhythm and normal heart sounds.   Pulmonary/Chest: Effort normal and breath sounds normal.  Abdominal: Soft. Bowel sounds are normal.  Genitourinary: Rectal exam shows guaiac positive stool.  Genitourinary Comments: Rectal exam had some skin tags perianal no anal fissure no prolapsed internal hemorrhoids. Pink heme positive on rectal exam no clots no gross blood.  Musculoskeletal: Normal range of motion.  Neurological: He is alert and oriented to person, place, and time.  Nursing note and vitals reviewed.    ED Treatments / Results  Labs (all labs ordered are listed, but only abnormal results are displayed) Labs Reviewed  COMPREHENSIVE METABOLIC PANEL - Abnormal; Notable for the following:       Result Value   Sodium 133 (*)    Glucose, Bld 102 (*)    BUN 25 (*)    Creatinine, Ser 1.31 (*)    GFR calc non Af Amer 52 (*)    All other components within normal limits  CBC - Abnormal; Notable for the following:    RBC 4.02 (*)    Hemoglobin 11.8 (*)    HCT 34.8 (*)    All other components within normal limits  POC OCCULT BLOOD, ED  POC OCCULT BLOOD, ED  TYPE AND SCREEN    EKG  EKG Interpretation None       Radiology No results found.  Procedures Procedures (including critical care time)  Medications Ordered in ED Medications  0.9 %  sodium chloride infusion ( Intravenous New Bag/Given 08/15/17 1852)   sodium phosphate (FLEET) 7-19 GM/118ML enema 1 enema (not administered)     Initial Impression / Assessment and Plan / ED Course  I have reviewed the triage vital signs and the nursing notes.  Pertinent labs & imaging results that were available during my care of the patient were reviewed by me and considered in my medical decision making (see chart for details).    Rectal exam only with pink no gross blood. Discussed with Dr. Gwenlyn Fudge from gastroenterology is going to do a flex sigmoidoscopy and determine whether admission is required or not. Patient stable patient's labs without significant abnormality.   Final Clinical Impressions(s) / ED Diagnoses   Final diagnoses:  Gastrointestinal hemorrhage, unspecified gastrointestinal hemorrhage type    New Prescriptions New Prescriptions   No medications on file     Fredia Sorrow, MD 08/15/17 2017

## 2017-08-15 NOTE — ED Triage Notes (Signed)
Pt reports had colonoscopy x12 days ago. Pt reports had two polyps removed that were "fine". Pt reports intermittent abd cramping since this afternoon bowel movement. Pt reports bright red rectal bleeding. Pt alert and oriented. Denies weakness or dizziness.

## 2017-08-15 NOTE — Telephone Encounter (Signed)
Patient called the office, States that he had TCS on 08/03/2017. This afternoon he felt that he was going to pass gas , when he did bright red blood blood ran down his legs. When he sits down on the toilet he states that he bleeds. Dr.Rehman was made aware. He felt that the patient should come to the ED for further evaluation. Patient was called and made aware and he is on his way.

## 2017-08-15 NOTE — ED Notes (Signed)
POC occult blood positive  

## 2017-08-15 NOTE — Discharge Instructions (Signed)
No aspirin or NSAIDs for 1 week. Resume other medications and diet as before. No driving for 24 hours. Will ask Dr. Quillian Quince to  Hemoglobin and hematocrit at the time of your office visit next month.   Colonoscopy, Adult, Care After This sheet gives you information about how to care for yourself after your procedure. Your health care provider may also give you more specific instructions. If you have problems or questions, contact your health care provider. What can I expect after the procedure? After the procedure, it is common to have:  A small amount of blood in your stool for 24 hours after the procedure.  Some gas.  Mild abdominal cramping or bloating.  Follow these instructions at home: General instructions   For the first 24 hours after the procedure: ? Do not drive or use machinery. ? Do not sign important documents. ? Do not drink alcohol. ? Do your regular daily activities at a slower pace than normal. ? Eat soft, easy-to-digest foods. ? Rest often.  Take over-the-counter or prescription medicines only as told by your health care provider.  It is up to you to get the results of your procedure. Ask your health care provider, or the department performing the procedure, when your results will be ready. Relieving cramping and bloating  Try walking around when you have cramps or feel bloated.  Apply heat to your abdomen as told by your health care provider. Use a heat source that your health care provider recommends, such as a moist heat pack or a heating pad. ? Place a towel between your skin and the heat source. ? Leave the heat on for 20-30 minutes. ? Remove the heat if your skin turns bright red. This is especially important if you are unable to feel pain, heat, or cold. You may have a greater risk of getting burned. Eating and drinking  Drink enough fluid to keep your urine clear or pale yellow.  Resume your normal diet as instructed by your health care provider.  Avoid heavy or fried foods that are hard to digest.  Avoid drinking alcohol for as long as instructed by your health care provider. Contact a health care provider if:  You have blood in your stool 2-3 days after the procedure. Get help right away if:  You have more than a small spotting of blood in your stool.  You pass large blood clots in your stool.  Your abdomen is swollen.  You have nausea or vomiting.  You have a fever.  You have increasing abdominal pain that is not relieved with medicine. This information is not intended to replace advice given to you by your health care provider. Make sure you discuss any questions you have with your health care provider.

## 2017-08-15 NOTE — Consult Note (Signed)
Primary Care Physician:  Caryl Bis, MD Primary Gastroenterologist:  Dr. Laural Golden  Presenting complaint;  Rectal bleeding 12 days after polypectomy.  HPI:   Patient is 76 year old Caucasian male with history of colonic adenomas. He underwent surveillance colonoscopy on 08/03/2017. He had small polyp removed via cold biopsy from ascending colon. He had large distal rectal polyp hot snared. Both of these polyps were tubular adenomas. Patient has been doing well. This morning he had formed stool. About 3-1/2 hours ago he thought he needed to pass flatus but past bright red blood per rectum. He did not experience abdominal pain nausea vomitng fever or chills. Patient called office and he was advised to come to emergency room. He has not had anymore rectal bleeding.Dr. Rogene Houston  digital rectal exam and noted some pink blood in the rectum. His hemoglobin is 11.8 g.he has not started low-dose aspirin yet but he did take Alka-Seltzer last evening. He denies fever chills diarrhea or postural symptoms.  Review of lab data reveals. Hemoglobin of 11.9 g on 03/16/2017. However H&H was normal at 14.2 and 42.1 on 11/11/2015.    Past Medical History:  Diagnosis Date  . CAD (coronary artery disease) of artery bypass graft    1.Status post coronary artery bypass grafting. 2. Normal left ventricular function. 3. Increase left atrial dilatation.  . OA (osteoarthritis) of knee     Past Surgical History:  Procedure Laterality Date  . COLONOSCOPY N/A 08/03/2017   Procedure: COLONOSCOPY;  Surgeon: Rogene Houston, MD;  Location: [facility];  Service: Endoscopy;  Laterality: N/A;  730  . CORONARY ARTERY BYPASS GRAFT    . JOINT REPLACEMENT Left 2012   Left knee  . KNEE ARTHROSCOPY      Prior to Admission medications   Medication Sig Start Date End Date Taking? Authorizing Provider  aspirin 81 MG tablet Take 1 tablet (81 mg total) by mouth every other day. 08/10/17  Yes Roosvelt Churchwell, Mechele Dawley, MD   fluticasone (FLONASE) 50 MCG/ACT nasal spray Place 1 spray into the nose at bedtime.  03/22/12  Yes [provider]  lisinopril-hydrochlorothiazide (PRINZIDE,ZESTORETIC) 20-12.5 MG tablet Take 1 tablet by mouth daily.   Yes [provider]  loratadine (CLARITIN) 10 MG tablet Take 10 mg by mouth daily.   Yes [provider]  Multiple Vitamin (MULTIVITAMIN) tablet Take 1 tablet by mouth daily.     Yes [provider]  nitroGLYCERIN (NITROSTAT) 0.4 MG SL tablet Place 1 tablet (0.4 mg total) under the tongue every 5 (five) minutes as needed. 11/12/15  Yes Lendon Colonel, NP  pantoprazole (PROTONIX) 40 MG tablet Take 40 mg by mouth daily.     Yes [provider]  rosuvastatin (CRESTOR) 20 MG tablet Take 20 mg by mouth daily with lunch.    Yes [provider]  oxyCODONE-acetaminophen (PERCOCET) 5-325 MG per tablet Take 1 tablet by mouth every 4 (four) hours as needed for severe pain.     [provider]    Current Facility-Administered Medications  Medication Dose Route Frequency Provider Last Rate Last Dose  . 0.9 %  sodium chloride infusion   Intravenous Continuous Fredia Sorrow, MD 75 mL/hr at 08/15/17 1852    . sodium phosphate (FLEET) 7-19 GM/118ML enema 1 enema  1 enema Rectal Once Daekwon Beswick, Mechele Dawley, MD       Current Outpatient Prescriptions  Medication Sig Dispense Refill  . aspirin 81 MG tablet Take 1 tablet (81 mg total) by mouth every other  day. 30 tablet   . fluticasone (FLONASE) 50 MCG/ACT nasal spray Place 1 spray into the nose at bedtime.     Marland Kitchen lisinopril-hydrochlorothiazide (PRINZIDE,ZESTORETIC) 20-12.5 MG tablet Take 1 tablet by mouth daily.    Marland Kitchen loratadine (CLARITIN) 10 MG tablet Take 10 mg by mouth daily.    . Multiple Vitamin (MULTIVITAMIN) tablet Take 1 tablet by mouth daily.      . nitroGLYCERIN (NITROSTAT) 0.4 MG SL tablet Place 1 tablet (0.4 mg total) under the tongue every 5 (five) minutes as needed. 25  tablet 3  . pantoprazole (PROTONIX) 40 MG tablet Take 40 mg by mouth daily.      . rosuvastatin (CRESTOR) 20 MG tablet Take 20 mg by mouth daily with lunch.     . oxyCODONE-acetaminophen (PERCOCET) 5-325 MG per tablet Take 1 tablet by mouth every 4 (four) hours as needed for severe pain.       Allergies as of 08/15/2017 - Review Complete 08/15/2017  Allergen Reaction Noted  . Morphine      Family History  Problem Relation Age of Onset  . Coronary artery disease Unknown   . Coronary artery disease Mother   . Coronary artery disease Father   . Heart attack Father     Social History   Social History  . Marital status: Married    Spouse name: N/A  . Number of children: N/A  . Years of education: N/A   Occupational History  . Not on file.   Social History Main Topics  . Smoking status: Never Smoker  . Smokeless tobacco: Never Used  . Alcohol use No  . Drug use: No  . Sexual activity: Not on file   Other Topics Concern  . Not on file   Social History Narrative  . No narrative on file    Review of Systems: See HPI, otherwise normal ROS  Physical Exam: Temp:  [98.6 F (37 C)] 98.6 F (37 C) (09/12 1726) Pulse Rate:  [65-87] 65 (09/12 1922) Resp:  [13-18] 18 (09/12 1922) BP: (110-114)/(66-82) 110/66 (09/12 1922) SpO2:  [94 %-98 %] 98 % (09/12 1922) Weight:  [200 lb (90.7 kg)] 200 lb (90.7 kg) (09/12 1723)   Patient was seen  In emergency room. He is alert and in no acute distress. Conjunctiva was pink. Sclera is nonicteric. Oropharyngeal mucosa is normal. No neck masses or thyromegaly noted. Cardiac exam with regular rhythm normal S1 and S2. No murmur or gallop noted. Lungs are clear to auscultation. Abdomen is full. Small umbilical hernia noted. It is soft and not reducible. It feels like omentum. Bowel sounds are normal. On palpation abdomen is soft and nontender without organomegaly or masses. No peripheral edema or clubbing noted.   Lab  Results:  Recent Labs  08/15/17 1727  WBC 7.3  HGB 11.8*  HCT 34.8*  PLT 203   BMET  Recent Labs  08/15/17 1727  NA 133*  K 3.6  CL 101  CO2 22  GLUCOSE 102*  BUN 25*  CREATININE 1.31*  CALCIUM 9.4   LFT  Recent Labs  08/15/17 1727  PROT 7.3  ALBUMIN 3.9  AST 21  ALT 17  ALKPHOS 66  BILITOT 0.4    Assessment;  Delayed post polypectomy bleed most likely from rectum from where he had large rectal polyp hot snared. Patient is hemodynamically stable. His hemoglobin is low but he may have chronic anemia.  Recommendations;  Flexible sigmoidoscopy with therapeutic intention. I have explained procedure and risks with  the patient and his wife and they're both agreeable.   LOS: 0 days   Copelan Maultsby  08/15/2017, 7:44 PM

## 2017-08-15 NOTE — Op Note (Signed)
[facility] Patient Name: [name] [name] Procedure Date: 08/15/2017 7:52 PM MRN: [id number] Date of Birth: [birth year] Attending MD: Hildred Laser , MD CSN: [id number] Age: 76 Admit Type: Outpatient Procedure:                Flexible Sigmoidoscopy Indications:              Treatment of bleeding from polypectomy site;                            colonoscopy with polypectomy on 08/03/2017; took                            650 mg of aspirin last night. Providers:                Hildred Laser, MD, Lurline Del, RN, Rosina Lowenstein, RN Referring MD:             Mitzie Na. Quillian Quince, MD Medicines:                Meperidine 50 mg IV, Midazolam 5 mg IV Complications:            No immediate complications. Estimated Blood Loss:     Estimated blood loss: none. Procedure:                Pre-Anesthesia Assessment:                           - Prior to the procedure, a History and Physical                            was performed, and patient medications and                            allergies were reviewed. The patient's tolerance of                            previous anesthesia was also reviewed. The risks                            and benefits of the procedure and the sedation                            options and risks were discussed with the patient.                            All questions were answered, and informed consent                            was obtained. Prior Anticoagulants: The patient                            last took aspirin 1 day prior to the procedure. ASA                            Grade Assessment: II - A patient with mild systemic  disease. After reviewing the risks and benefits,                            the patient was deemed in satisfactory condition to                            undergo the procedure.                           After obtaining informed consent, the scope was                            passed under direct vision. The [id number]                           ([id number]) scope was introduced through the anus and                            advanced to the the sigmoid colon. The flexible                            sigmoidoscopy was accomplished without difficulty.                            The patient tolerated the procedure well. The                            quality of the bowel preparation was adequate. The                            total duration of the procedure was 25 minutes. Scope In: 8:33:02 PM Scope Out: 8:43:59 PM Total Procedure Duration: 0 hours 10 minutes 57 seconds  Findings:      The perianal and digital rectal examinations were normal.      The sigmoid colon appeared normal.      A single (solitary) ulcer was found in the distal rectum at site of       previous polypectomy. Oozing was present. To stop active bleeding, three       hemostatic clips were successfully placed (MR conditional). There was no       bleeding at the end of the procedure. Impression:               - The sigmoid colon is normal.                           - A single (solitary) ulcer in the distal rectum.                            Clips (MR conditional) were placed.                           - No specimens collected. Moderate Sedation:      Moderate (conscious) sedation was administered by the endoscopy nurse       and supervised by the endoscopist. The following parameters were  monitored: oxygen saturation, heart rate, blood pressure, CO2       capnography and response to care. Total physician intraservice time was       25 minutes. Recommendation:           - Discharge patient to home (with spouse).                           - Continue present medications.                           - No ibuprofen, naproxen, or other non-steroidal                            anti-inflammatory drugs for 7 days.                           - H&H by Dr. Quillian Quince in one month. Procedure Code(s):        --- Professional ---                            [phone], Sigmoidoscopy, flexible; with control of                            bleeding, any method                           99152, Moderate sedation services provided by the                            same physician or other qualified health care                            professional performing the diagnostic or                            therapeutic service that the sedation supports,                            requiring the presence of an independent trained                            observer to assist in the monitoring of the                            patient's level of consciousness and physiological                            status; initial 15 minutes of intraservice time,                            patient age 62 years or older                           [phone], Moderate sedation services; each additional  15 minutes intraservice time Diagnosis Code(s):        --- Professional ---                           K62.6, Ulcer of anus and rectum                           K91.840, Postprocedural hemorrhage of a digestive                            system organ or structure following a digestive                            system procedure CPT copyright 2016 [employer]. All rights reserved. The codes documented in this report are preliminary and upon coder review may  be revised to meet current compliance requirements. Hildred Laser, MD Hildred Laser, MD 08/15/2017 8:58:36 PM This report has been signed electronically. Number of Addenda: 0

## 2017-08-16 LAB — OCCULT BLOOD, POC DEVICE: FECAL OCCULT BLD: POSITIVE — AB

## 2017-08-17 ENCOUNTER — Encounter (HOSPITAL_COMMUNITY): Payer: Self-pay | Admitting: Internal Medicine

## 2017-09-14 DIAGNOSIS — Z9189 Other specified personal risk factors, not elsewhere classified: Secondary | ICD-10-CM | POA: Diagnosis not present

## 2017-09-14 DIAGNOSIS — D649 Anemia, unspecified: Secondary | ICD-10-CM | POA: Diagnosis not present

## 2017-09-14 DIAGNOSIS — K219 Gastro-esophageal reflux disease without esophagitis: Secondary | ICD-10-CM | POA: Diagnosis not present

## 2017-09-14 DIAGNOSIS — I10 Essential (primary) hypertension: Secondary | ICD-10-CM | POA: Diagnosis not present

## 2017-09-14 DIAGNOSIS — I25111 Atherosclerotic heart disease of native coronary artery with angina pectoris with documented spasm: Secondary | ICD-10-CM | POA: Diagnosis not present

## 2017-09-14 DIAGNOSIS — D519 Vitamin B12 deficiency anemia, unspecified: Secondary | ICD-10-CM | POA: Diagnosis not present

## 2017-09-14 DIAGNOSIS — E782 Mixed hyperlipidemia: Secondary | ICD-10-CM | POA: Diagnosis not present

## 2017-09-20 DIAGNOSIS — E782 Mixed hyperlipidemia: Secondary | ICD-10-CM | POA: Diagnosis not present

## 2017-09-20 DIAGNOSIS — I10 Essential (primary) hypertension: Secondary | ICD-10-CM | POA: Diagnosis not present

## 2017-09-20 DIAGNOSIS — I25111 Atherosclerotic heart disease of native coronary artery with angina pectoris with documented spasm: Secondary | ICD-10-CM | POA: Diagnosis not present

## 2017-09-20 DIAGNOSIS — Z0001 Encounter for general adult medical examination with abnormal findings: Secondary | ICD-10-CM | POA: Diagnosis not present

## 2017-09-20 DIAGNOSIS — K429 Umbilical hernia without obstruction or gangrene: Secondary | ICD-10-CM | POA: Diagnosis not present

## 2017-09-20 DIAGNOSIS — J301 Allergic rhinitis due to pollen: Secondary | ICD-10-CM | POA: Diagnosis not present

## 2017-09-20 DIAGNOSIS — M1711 Unilateral primary osteoarthritis, right knee: Secondary | ICD-10-CM | POA: Diagnosis not present

## 2017-09-20 DIAGNOSIS — Z6827 Body mass index (BMI) 27.0-27.9, adult: Secondary | ICD-10-CM | POA: Diagnosis not present

## 2017-09-20 DIAGNOSIS — K219 Gastro-esophageal reflux disease without esophagitis: Secondary | ICD-10-CM | POA: Diagnosis not present

## 2018-03-08 DIAGNOSIS — K219 Gastro-esophageal reflux disease without esophagitis: Secondary | ICD-10-CM | POA: Diagnosis not present

## 2018-03-08 DIAGNOSIS — I10 Essential (primary) hypertension: Secondary | ICD-10-CM | POA: Diagnosis not present

## 2018-03-08 DIAGNOSIS — J301 Allergic rhinitis due to pollen: Secondary | ICD-10-CM | POA: Diagnosis not present

## 2018-03-08 DIAGNOSIS — Z6827 Body mass index (BMI) 27.0-27.9, adult: Secondary | ICD-10-CM | POA: Diagnosis not present

## 2018-03-08 DIAGNOSIS — Z9189 Other specified personal risk factors, not elsewhere classified: Secondary | ICD-10-CM | POA: Diagnosis not present

## 2018-03-08 DIAGNOSIS — K429 Umbilical hernia without obstruction or gangrene: Secondary | ICD-10-CM | POA: Diagnosis not present

## 2018-03-08 DIAGNOSIS — Z79891 Long term (current) use of opiate analgesic: Secondary | ICD-10-CM | POA: Diagnosis not present

## 2018-03-08 DIAGNOSIS — Z1389 Encounter for screening for other disorder: Secondary | ICD-10-CM | POA: Diagnosis not present

## 2018-03-08 DIAGNOSIS — I25111 Atherosclerotic heart disease of native coronary artery with angina pectoris with documented spasm: Secondary | ICD-10-CM | POA: Diagnosis not present

## 2018-03-08 DIAGNOSIS — M1711 Unilateral primary osteoarthritis, right knee: Secondary | ICD-10-CM | POA: Diagnosis not present

## 2018-03-08 DIAGNOSIS — E782 Mixed hyperlipidemia: Secondary | ICD-10-CM | POA: Diagnosis not present

## 2018-04-06 ENCOUNTER — Encounter (HOSPITAL_COMMUNITY): Payer: Self-pay | Admitting: Emergency Medicine

## 2018-04-06 ENCOUNTER — Emergency Department (HOSPITAL_COMMUNITY)
Admission: EM | Admit: 2018-04-06 | Discharge: 2018-04-06 | Disposition: A | Discharge status: Home / Self Care | Payer: Medicare Other | Attending: Emergency Medicine | Admitting: Emergency Medicine

## 2018-04-06 ENCOUNTER — Emergency Department (HOSPITAL_COMMUNITY): Payer: Medicare Other

## 2018-04-06 DIAGNOSIS — Z79899 Other long term (current) drug therapy: Secondary | ICD-10-CM | POA: Diagnosis not present

## 2018-04-06 DIAGNOSIS — R42 Dizziness and giddiness: Secondary | ICD-10-CM

## 2018-04-06 DIAGNOSIS — Z7982 Long term (current) use of aspirin: Secondary | ICD-10-CM | POA: Insufficient documentation

## 2018-04-06 DIAGNOSIS — Z951 Presence of aortocoronary bypass graft: Secondary | ICD-10-CM | POA: Insufficient documentation

## 2018-04-06 DIAGNOSIS — D649 Anemia, unspecified: Secondary | ICD-10-CM | POA: Diagnosis not present

## 2018-04-06 DIAGNOSIS — Z96652 Presence of left artificial knee joint: Secondary | ICD-10-CM | POA: Diagnosis not present

## 2018-04-06 DIAGNOSIS — I2510 Atherosclerotic heart disease of native coronary artery without angina pectoris: Secondary | ICD-10-CM | POA: Insufficient documentation

## 2018-04-06 DIAGNOSIS — R110 Nausea: Secondary | ICD-10-CM | POA: Diagnosis not present

## 2018-04-06 LAB — BASIC METABOLIC PANEL
ANION GAP: 13 (ref 5–15)
BUN: 21 mg/dL — AB (ref 6–20)
CHLORIDE: 99 mmol/L — AB (ref 101–111)
CO2: 21 mmol/L — ABNORMAL LOW (ref 22–32)
CREATININE: 1.03 mg/dL (ref 0.61–1.24)
Calcium: 9.1 mg/dL (ref 8.9–10.3)
GFR calc Af Amer: >60 mL/min (ref >60)
Glucose, Bld: 84 mg/dL (ref 65–99)
POTASSIUM: 3.9 mmol/L (ref 3.5–5.1)
SODIUM: 133 mmol/L — AB (ref 135–145)

## 2018-04-06 LAB — CBC WITH DIFFERENTIAL/PLATELET
BASOS ABS: 0 10*3/uL (ref 0.0–0.1)
Basophils Relative: 0 %
EOS ABS: 0 10*3/uL (ref 0.0–0.7)
Eosinophils Relative: 0 %
HCT: 33.3 % — ABNORMAL LOW (ref 39.0–52.0)
Hemoglobin: 10.9 g/dL — ABNORMAL LOW (ref 13.0–17.0)
LYMPHS ABS: 2.4 10*3/uL (ref 0.7–4.0)
LYMPHS PCT: 35 %
MCH: 28.6 pg (ref 26.0–34.0)
MCHC: 32.7 g/dL (ref 30.0–36.0)
MCV: 87.4 fL (ref 78.0–100.0)
MONOS PCT: 19 %
Monocytes Absolute: 1.3 10*3/uL — ABNORMAL HIGH (ref 0.1–1.0)
NEUTROS PCT: 46 %
Neutro Abs: 3.1 10*3/uL (ref 1.7–7.7)
Platelets: 233 10*3/uL (ref 150–400)
RBC: 3.81 MIL/uL — ABNORMAL LOW (ref 4.22–5.81)
RDW: 15.6 % — AB (ref 11.5–15.5)
WBC: 6.7 10*3/uL (ref 4.0–10.5)

## 2018-04-06 LAB — TROPONIN I

## 2018-04-06 MED ORDER — MECLIZINE HCL 12.5 MG PO TABS
25.0000 mg | ORAL_TABLET | Freq: Once | ORAL | Status: AC
  Administered 2018-04-06: 25 mg via ORAL
  Filled 2018-04-06: qty 2

## 2018-04-06 MED ORDER — MECLIZINE HCL 25 MG PO TABS: 25.0000 mg | ORAL_TABLET | Freq: Three times a day (TID) | ORAL | 1 refills | Status: DC | PRN

## 2018-04-06 NOTE — Discharge Instructions (Signed)
You are slightly anemic (hemoglobin 10.9).   This has not greatly changed in the last 7 years.  Your primary care doctor can follow-up with this.  Otherwise your tests were good.  Prescription for medicine for vertigo.

## 2018-04-06 NOTE — ED Notes (Signed)
To CT

## 2018-04-06 NOTE — ED Notes (Signed)
From CT 

## 2018-04-06 NOTE — ED Triage Notes (Signed)
Patient complaining of constant dizziness and nausea since yesterday. Denies pain.

## 2018-04-07 NOTE — ED Provider Notes (Signed)
[facility] EMERGENCY DEPARTMENT Provider Note   CSN: [id number] Arrival date & time: 04/06/18  1013     History   Chief Complaint Chief Complaint  Patient presents with  . Dizziness    HPI [name] [name] is a 77 y.o. male.  Patient reports dizziness and nausea for the past 24 hours.  He describes his dizziness as a sense of the room spinning.  He has needed to walk very carefully.  No confusion, facial asymmetry, motor or sensory deficits.  S medical history includes CABG x7 vessels in 2010.  He has taken nothing for his symptoms.  Severity is moderate.     Past Medical History:  Diagnosis Date  . CAD (coronary artery disease) of artery bypass graft    1.Status post coronary artery bypass grafting. 2. Normal left ventricular function. 3. Increase left atrial dilatation.  . OA (osteoarthritis) of knee     Patient Active Problem List   Diagnosis Date Noted  . History of colonic polyps 03/29/2017  . DYSLIPIDEMIA 10/15/2009  . SINUS BRADYCARDIA 10/15/2009  . DEGENERATIVE JOINT DISEASE, KNEE 10/15/2009  . CORONARY ARTERY BYPASS GRAFT, HX OF 10/15/2009  . CAD, ARTERY BYPASS GRAFT 09/11/2009    Past Surgical History:  Procedure Laterality Date  . COLONOSCOPY N/A 08/03/2017   Procedure: COLONOSCOPY;  Surgeon: Rogene Houston, MD;  Location: [facility];  Service: Endoscopy;  Laterality: N/A;  730  . CORONARY ARTERY BYPASS GRAFT    . FLEXIBLE SIGMOIDOSCOPY N/A 08/15/2017   Procedure: FLEXIBLE SIGMOIDOSCOPY;  Surgeon: Rogene Houston, MD;  Location: [facility];  Service: Endoscopy;  Laterality: N/A;  . JOINT REPLACEMENT Left 2012   Left knee  . KNEE ARTHROSCOPY          Home Medications    Prior to Admission medications   Medication Sig Start Date End Date Taking? Authorizing Provider  aspirin 81 MG tablet Take 1 tablet (81 mg total) by mouth every other day. 08/22/17  Yes Rehman, Mechele Dawley, MD  Cholecalciferol (VITAMIN D3) 400 units CAPS Take 400 Units by mouth  daily.   Yes [provider]  cyclobenzaprine (FLEXERIL) 10 MG tablet Take 10 mg by mouth 3 (three) times daily as needed for muscle spasms.   Yes [provider]  fluticasone (FLONASE) 50 MCG/ACT nasal spray Place 1 spray into the nose at bedtime.  03/22/12  Yes [provider]  lisinopril-hydrochlorothiazide (PRINZIDE,ZESTORETIC) 20-12.5 MG tablet Take 1 tablet by mouth daily.   Yes [provider]  loratadine (CLARITIN) 10 MG tablet Take 10 mg by mouth daily.   Yes [provider]  Multiple Vitamin (MULTIVITAMIN) tablet Take 1 tablet by mouth daily.     Yes [provider]  nitroGLYCERIN (NITROSTAT) 0.4 MG SL tablet Place 1 tablet (0.4 mg total) under the tongue every 5 (five) minutes as needed. 11/12/15  Yes Lendon Colonel, NP  Omega-3 Fatty Acids (FISH OIL) 600 MG CAPS Take 600 mg by mouth daily.   Yes [provider]  pantoprazole (PROTONIX) 40 MG tablet Take 40 mg by mouth daily.     Yes [provider]  rosuvastatin (CRESTOR) 20 MG tablet Take 20 mg by mouth daily with lunch.    Yes [provider]  meclizine (ANTIVERT) 25 MG tablet Take 1 tablet (25 mg total) by mouth 3 (three) times daily as needed for dizziness. 04/06/18   Nat Christen, MD    Family History Family History  Problem Relation Age of  Onset  . Coronary artery disease Unknown   . Coronary artery disease Mother   . Coronary artery disease Father   . Heart attack Father     Social History Social History   Tobacco Use  . Smoking status: Never Smoker  . Smokeless tobacco: Never Used  Substance Use Topics  . Alcohol use: No    Alcohol/week: 0.0 oz  . Drug use: No     Allergies   Morphine   Review of Systems Review of Systems  All other systems reviewed and are negative.    Physical Exam Updated Vital Signs BP 136/71 (BP Location: Right Arm)   Pulse 70   Resp 16   Ht 6' (1.829 m)   Wt 93.4 kg (206 lb)   SpO2 100%   BMI  27.94 kg/m   Physical Exam  Constitutional: He is oriented to person, place, and time. He appears well-developed and well-nourished.  HENT:  Head: Normocephalic and atraumatic.  Eyes: Conjunctivae are normal.  Neck: Neck supple.  Cardiovascular: Normal rate and regular rhythm.  Pulmonary/Chest: Effort normal and breath sounds normal.  Abdominal: Soft. Bowel sounds are normal.  Musculoskeletal: Normal range of motion.  Neurological: He is alert and oriented to person, place, and time.  Skin: Skin is warm and dry.  Psychiatric: He has a normal mood and affect. His behavior is normal.  Nursing note and vitals reviewed.    ED Treatments / Results  Labs (all labs ordered are listed, but only abnormal results are displayed) Labs Reviewed  CBC WITH DIFFERENTIAL/PLATELET - Abnormal; Notable for the following components:      Result Value   RBC 3.81 (*)    Hemoglobin 10.9 (*)    HCT 33.3 (*)    RDW 15.6 (*)    Monocytes Absolute 1.3 (*)    All other components within normal limits  BASIC METABOLIC PANEL - Abnormal; Notable for the following components:   Sodium 133 (*)    Chloride 99 (*)    CO2 21 (*)    BUN 21 (*)    All other components within normal limits  TROPONIN I    EKG EKG Interpretation  Date/Time:  Saturday Apr 06 2018 10:31:20 EDT Ventricular Rate:  72 PR Interval:    QRS Duration: 100 QT Interval:  395 QTC Calculation: 433 R Axis:   -43 Text Interpretation:  Sinus or ectopic atrial rhythm Left axis deviation RSR' in V1 or V2, right VCD or RVH Confirmed by Nat Christen [phone]) on 04/06/2018 11:20:33 AM Also confirmed by Nat Christen [phone])  on 04/06/2018 12:46:55 PM   Radiology Ct Head Wo Contrast  Result Date: 04/06/2018 CLINICAL DATA:  Constant dizziness and nausea. EXAM: CT HEAD WITHOUT CONTRAST TECHNIQUE: Contiguous axial images were obtained from the base of the skull through the vertex without intravenous contrast. COMPARISON:  None. FINDINGS: Brain: No  evidence of acute infarction, hemorrhage, hydrocephalus, extra-axial collection or mass lesion/mass effect. Deep white matter microangiopathy. Mild brain parenchymal volume loss. Vascular: Calcific atherosclerotic disease at the skull base. Skull: Normal. Negative for fracture or focal lesion. Sinuses/Orbits: No acute finding. Other: None. IMPRESSION: No acute intracranial abnormality. Deep white matter microvascular disease. Mild brain parenchymal volume loss. Electronically Signed   By: Fidela Salisbury M.D.   On: 04/06/2018 13:27    Procedures Procedures (including critical care time)  Medications Ordered in ED Medications  meclizine (ANTIVERT) tablet 25 mg (25 mg Oral Given 04/06/18 1223)     Initial Impression / Assessment  and Plan / ED Course  I have reviewed the triage vital signs and the nursing notes.  Pertinent labs & imaging results that were available during my care of the patient were reviewed by me and considered in my medical decision making (see chart for details).    Patient presents with dizziness and slight ataxia.  CT negative for acute findings.  He is slightly anemic.  History and physical most consistent with benign positional vertigo.  Meclizine 25 mg helped.  Will discharge home with same medication.  Discussed with patient and his wife   Final Clinical Impressions(s) / ED Diagnoses   Final diagnoses:  Vertigo  Anemia, unspecified type    ED Discharge Orders        Ordered    meclizine (ANTIVERT) 25 MG tablet  3 times daily PRN     04/06/18 1359       Nat Christen, MD 04/07/18 1022

## 2018-05-13 ENCOUNTER — Encounter: Payer: Self-pay | Admitting: *Deleted

## 2018-05-13 ENCOUNTER — Ambulatory Visit (INDEPENDENT_AMBULATORY_CARE_PROVIDER_SITE_OTHER): Payer: Medicare Other | Admitting: Cardiovascular Disease

## 2018-05-13 VITALS — BP 120/70 | HR 70 | Ht 72.0 in | Wt 216.2 lb

## 2018-05-13 DIAGNOSIS — H9313 Tinnitus, bilateral: Secondary | ICD-10-CM | POA: Diagnosis not present

## 2018-05-13 DIAGNOSIS — I25708 Atherosclerosis of coronary artery bypass graft(s), unspecified, with other forms of angina pectoris: Secondary | ICD-10-CM

## 2018-05-13 DIAGNOSIS — R0602 Shortness of breath: Secondary | ICD-10-CM

## 2018-05-13 DIAGNOSIS — I10 Essential (primary) hypertension: Secondary | ICD-10-CM

## 2018-05-13 DIAGNOSIS — E785 Hyperlipidemia, unspecified: Secondary | ICD-10-CM

## 2018-05-13 NOTE — Progress Notes (Signed)
SUBJECTIVE: The patient presents for follow-up of coronary artery disease and three-vessel CABG on 02/12/2009.  Nuclear stress test on 11/10/15 demonstrated a hypertensive response to exercise. It also demonstrated a mild degree of ischemia in the anterior, inferior, and inferoseptal walls. It was deemed a low to intermediate risk study. Calculated LVEF 53% with inferoseptal hypokinesis.  He preferred to avoid cath.  He was evaluated for dizziness in the ED on 04/06/2018.  I reviewed the electronic medical record.  It was felt to be due to benign positional vertigo.  He was given meclizine.  He is here with his wife, Mariann Laster.  He told me the meclizine made him worse at home so he stopped taking it.  If he turns his head quickly to the side he does become dizzy.  He told me he has not mentioned this to his PCP.  If he bends over and sits up too quickly he feels like he may pass out.  He has not been checking his blood pressure during these episodes.  He denies chest pain.  He has chronic exertional dyspnea which did not improve after CABG.  If he walks uphill in his backyard from his shop to the house he gets short of breath.  If he walks on level ground he does fine.  He said his hands and feet have been a bit more swollen.  He has chronic tinnitus and last saw an ENT specialist decades ago.      Review of Systems: As per "subjective", otherwise negative.  Allergies  Allergen Reactions  . Morphine     REACTION: dizziness    Current Outpatient Medications  Medication Sig Dispense Refill  . aspirin 81 MG tablet Take 1 tablet (81 mg total) by mouth every other day. 30 tablet   . Cholecalciferol (VITAMIN D) 2000 units CAPS Take 1 capsule by mouth daily.    . cyclobenzaprine (FLEXERIL) 10 MG tablet Take 10 mg by mouth 3 (three) times daily as needed for muscle spasms.    . ferrous gluconate (FERGON) 324 MG tablet Take 324 mg by mouth 3 (three) times daily with meals.    .  fluticasone (FLONASE) 50 MCG/ACT nasal spray Place 1 spray into the nose at bedtime.     Marland Kitchen lisinopril-hydrochlorothiazide (PRINZIDE,ZESTORETIC) 20-12.5 MG tablet Take 1 tablet by mouth daily.    Marland Kitchen loratadine (CLARITIN) 10 MG tablet Take 10 mg by mouth daily.    . Multiple Vitamin (MULTIVITAMIN) tablet Take 1 tablet by mouth daily.      . nitroGLYCERIN (NITROSTAT) 0.4 MG SL tablet Place 1 tablet (0.4 mg total) under the tongue every 5 (five) minutes as needed. 25 tablet 3  . Omega-3 Fatty Acids (FISH OIL) 600 MG CAPS Take 600 mg by mouth daily.    Marland Kitchen oxyCODONE-acetaminophen (PERCOCET/ROXICET) 5-325 MG tablet Take 1 tablet by mouth every 4 (four) hours as needed for severe pain.    . pantoprazole (PROTONIX) 40 MG tablet Take 40 mg by mouth daily.      . rosuvastatin (CRESTOR) 20 MG tablet Take 20 mg by mouth daily with lunch.      No current facility-administered medications for this visit.     Past Medical History:  Diagnosis Date  . CAD (coronary artery disease) of artery bypass graft    1.Status post coronary artery bypass grafting. 2. Normal left ventricular function. 3. Increase left atrial dilatation.  . OA (osteoarthritis) of knee     Past Surgical History:  Procedure Laterality Date  . COLONOSCOPY N/A 08/03/2017   Procedure: COLONOSCOPY;  Surgeon: Rogene Houston, MD;  Location: [facility];  Service: Endoscopy;  Laterality: N/A;  730  . CORONARY ARTERY BYPASS GRAFT    . FLEXIBLE SIGMOIDOSCOPY N/A 08/15/2017   Procedure: FLEXIBLE SIGMOIDOSCOPY;  Surgeon: Rogene Houston, MD;  Location: [facility];  Service: Endoscopy;  Laterality: N/A;  . JOINT REPLACEMENT Left 2012   Left knee  . KNEE ARTHROSCOPY      Social History   Socioeconomic History  . Marital status: Married    Spouse name: Not on file  . Number of children: Not on file  . Years of education: Not on file  . Highest education level: Not on file  Occupational History  . Not on file  Social Needs  .  Financial resource strain: Not on file  . Food insecurity:    Worry: Not on file    Inability: Not on file  . Transportation needs:    Medical: Not on file    Non-medical: Not on file  Tobacco Use  . Smoking status: Never Smoker  . Smokeless tobacco: Never Used  Substance and Sexual Activity  . Alcohol use: No    Alcohol/week: 0.0 oz  . Drug use: No  . Sexual activity: Not on file  Lifestyle  . Physical activity:    Days per week: Not on file    Minutes per session: Not on file  . Stress: Not on file  Relationships  . Social connections:    Talks on phone: Not on file    Gets together: Not on file    Attends religious service: Not on file    Active member of club or organization: Not on file    Attends meetings of clubs or organizations: Not on file    Relationship status: Not on file  . Intimate partner violence:    Fear of current or ex partner: Not on file    Emotionally abused: Not on file    Physically abused: Not on file    Forced sexual activity: Not on file  Other Topics Concern  . Not on file  Social History Narrative  . Not on file     Vitals:   05/13/18 1004  BP: 120/70  Pulse: 70  SpO2: 95%  Weight: 216 lb 3.2 oz (98.1 kg)  Height: 6' (1.829 m)    Wt Readings from Last 3 Encounters:  05/13/18 216 lb 3.2 oz (98.1 kg)  04/06/18 206 lb (93.4 kg)  08/15/17 200 lb (90.7 kg)     PHYSICAL EXAM General: NAD HEENT: Normal. Neck: No JVD, no thyromegaly. Lungs: Clear to auscultation bilaterally with normal respiratory effort. CV: Regular rate and rhythm, normal S1/S2, no S3/S4, no murmur. No pretibial or periankle edema.  No carotid bruit.   Abdomen: Soft, nontender, no distention.  Neurologic: Alert and oriented.  Psych: Normal affect. Skin: Normal. Musculoskeletal: No gross deformities.    ECG: Most recent ECG reviewed.   Labs: Lab Results  Component Value Date/Time   K 3.9 04/06/2018 10:43 AM   BUN 21 (H) 04/06/2018 10:43 AM    CREATININE 1.03 04/06/2018 10:43 AM   ALT 17 08/15/2017 05:27 PM   HGB 10.9 (L) 04/06/2018 10:43 AM     Lipids: No results found for: LDLCALC, LDLDIRECT, CHOL, TRIG, HDL     ASSESSMENT AND PLAN: 1. CAD with history of CABG with exertional dyspnea:  He denies a history of pulmonary  disease.  CABG was 9 years ago.  Symptoms are primarily experienced when walking uphill.  Blood pressure is normal today.   I will proceed with a nuclear myocardial perfusion imaging study to evaluate for ischemic heart disease (Lexiscan Myoview). Continue aspirin and Crestor.  2. Hypercholesterolemia:  I reviewed lipids dated 09/14/2017-total cholesterol 121, triglycerides 67, HDL 37, LDL 71. Continue Crestor.  3. Essential HTN: Controlled. No changes to therapy.  4.  Chronic bilateral tinnitus: He said he has experienced these symptoms for 50 years.  I encouraged him to seek the evaluation of an ENT specialist.      Disposition: Follow up 6 months   Kate Sable, M.D., F.A.C.C.

## 2018-05-13 NOTE — Patient Instructions (Signed)
Medication Instructions:  Your physician recommends that you continue on your current medications as directed. Please refer to the Current Medication list given to you today.   Labwork: NONE   Testing/Procedures: Your physician has requested that you have a lexiscan myoview. For further information please visit [email or web address]. Please follow instruction sheet, as given.    Follow-Up: Your physician wants you to follow-up in: 6 Months with Dr. Bronson Ing.  You will receive a reminder letter in the mail two months in advance. If you don't receive a letter, please call our office to schedule the follow-up appointment.   Any Other Special Instructions Will Be Listed Below (If Applicable).     If you need a refill on your cardiac medications before your next appointment, please call your pharmacy. Thank you for choosing [facility]!

## 2018-05-22 ENCOUNTER — Encounter (HOSPITAL_COMMUNITY)
Admission: RE | Admit: 2018-05-22 | Discharge: 2018-05-22 | Disposition: A | Discharge status: Home / Self Care | Payer: Medicare Other | Source: Ambulatory Visit | Attending: Cardiovascular Disease | Admitting: Cardiovascular Disease

## 2018-05-22 ENCOUNTER — Encounter (HOSPITAL_BASED_OUTPATIENT_CLINIC_OR_DEPARTMENT_OTHER)
Admission: RE | Admit: 2018-05-22 | Discharge: 2018-05-22 | Disposition: A | Discharge status: Home / Self Care | Payer: Medicare Other | Source: Ambulatory Visit | Attending: Cardiovascular Disease | Admitting: Cardiovascular Disease

## 2018-05-22 ENCOUNTER — Encounter (HOSPITAL_COMMUNITY): Payer: Self-pay

## 2018-05-22 DIAGNOSIS — R0602 Shortness of breath: Secondary | ICD-10-CM | POA: Diagnosis not present

## 2018-05-22 LAB — NM MYOCAR MULTI W/SPECT W/WALL MOTION / EF
LV dias vol: 98 mL (ref 62–150)
LV sys vol: 29 mL
Peak HR: 105 {beats}/min
RATE: 0.33
Rest HR: 64 {beats}/min
SDS: 8
SRS: 0
SSS: 8
TID: 0.84

## 2018-05-22 MED ORDER — REGADENOSON 0.4 MG/5ML IV SOLN
INTRAVENOUS | Status: AC
  Administered 2018-05-22: 0.4 mg via INTRAVENOUS
  Filled 2018-05-22: qty 5

## 2018-05-22 MED ORDER — TECHNETIUM TC 99M TETROFOSMIN IV KIT
30.0000 | PACK | Freq: Once | INTRAVENOUS | Status: AC | PRN
  Administered 2018-05-22: 32.8 via INTRAVENOUS

## 2018-05-22 MED ORDER — SODIUM CHLORIDE 0.9% FLUSH
INTRAVENOUS | Status: AC
  Administered 2018-05-22: 10 mL via INTRAVENOUS
  Filled 2018-05-22: qty 10

## 2018-05-22 MED ORDER — TECHNETIUM TC 99M TETROFOSMIN IV KIT
10.0000 | PACK | Freq: Once | INTRAVENOUS | Status: AC | PRN
  Administered 2018-05-22: 11 via INTRAVENOUS

## 2018-07-24 DIAGNOSIS — R188 Other ascites: Secondary | ICD-10-CM | POA: Diagnosis not present

## 2018-07-24 DIAGNOSIS — D529 Folate deficiency anemia, unspecified: Secondary | ICD-10-CM | POA: Diagnosis not present

## 2018-07-24 DIAGNOSIS — Z79891 Long term (current) use of opiate analgesic: Secondary | ICD-10-CM | POA: Diagnosis not present

## 2018-07-24 DIAGNOSIS — I2510 Atherosclerotic heart disease of native coronary artery without angina pectoris: Secondary | ICD-10-CM | POA: Diagnosis not present

## 2018-07-24 DIAGNOSIS — E782 Mixed hyperlipidemia: Secondary | ICD-10-CM | POA: Diagnosis not present

## 2018-07-24 DIAGNOSIS — D649 Anemia, unspecified: Secondary | ICD-10-CM | POA: Diagnosis not present

## 2018-07-24 DIAGNOSIS — Z6829 Body mass index (BMI) 29.0-29.9, adult: Secondary | ICD-10-CM | POA: Diagnosis not present

## 2018-07-24 DIAGNOSIS — R05 Cough: Secondary | ICD-10-CM | POA: Diagnosis not present

## 2018-07-24 DIAGNOSIS — I10 Essential (primary) hypertension: Secondary | ICD-10-CM | POA: Diagnosis not present

## 2018-07-24 DIAGNOSIS — D519 Vitamin B12 deficiency anemia, unspecified: Secondary | ICD-10-CM | POA: Diagnosis not present

## 2018-07-24 DIAGNOSIS — R600 Localized edema: Secondary | ICD-10-CM | POA: Diagnosis not present

## 2018-08-02 DIAGNOSIS — R0602 Shortness of breath: Secondary | ICD-10-CM | POA: Diagnosis not present

## 2018-08-02 DIAGNOSIS — R600 Localized edema: Secondary | ICD-10-CM | POA: Diagnosis not present

## 2018-08-02 DIAGNOSIS — I371 Nonrheumatic pulmonary valve insufficiency: Secondary | ICD-10-CM | POA: Diagnosis not present

## 2018-08-02 DIAGNOSIS — I358 Other nonrheumatic aortic valve disorders: Secondary | ICD-10-CM | POA: Diagnosis not present

## 2018-08-02 DIAGNOSIS — Z951 Presence of aortocoronary bypass graft: Secondary | ICD-10-CM | POA: Diagnosis not present

## 2018-08-02 DIAGNOSIS — I071 Rheumatic tricuspid insufficiency: Secondary | ICD-10-CM | POA: Diagnosis not present

## 2018-08-08 DIAGNOSIS — K429 Umbilical hernia without obstruction or gangrene: Secondary | ICD-10-CM | POA: Diagnosis not present

## 2018-08-08 DIAGNOSIS — N281 Cyst of kidney, acquired: Secondary | ICD-10-CM | POA: Diagnosis not present

## 2018-08-14 DIAGNOSIS — Z6828 Body mass index (BMI) 28.0-28.9, adult: Secondary | ICD-10-CM | POA: Diagnosis not present

## 2018-08-14 DIAGNOSIS — Z9189 Other specified personal risk factors, not elsewhere classified: Secondary | ICD-10-CM | POA: Diagnosis not present

## 2018-08-14 DIAGNOSIS — E782 Mixed hyperlipidemia: Secondary | ICD-10-CM | POA: Diagnosis not present

## 2018-08-14 DIAGNOSIS — M1711 Unilateral primary osteoarthritis, right knee: Secondary | ICD-10-CM | POA: Diagnosis not present

## 2018-08-14 DIAGNOSIS — I25111 Atherosclerotic heart disease of native coronary artery with angina pectoris with documented spasm: Secondary | ICD-10-CM | POA: Diagnosis not present

## 2018-08-14 DIAGNOSIS — J301 Allergic rhinitis due to pollen: Secondary | ICD-10-CM | POA: Diagnosis not present

## 2018-08-14 DIAGNOSIS — K219 Gastro-esophageal reflux disease without esophagitis: Secondary | ICD-10-CM | POA: Diagnosis not present

## 2018-08-14 DIAGNOSIS — Z79891 Long term (current) use of opiate analgesic: Secondary | ICD-10-CM | POA: Diagnosis not present

## 2018-08-14 DIAGNOSIS — D649 Anemia, unspecified: Secondary | ICD-10-CM | POA: Diagnosis not present

## 2018-08-14 DIAGNOSIS — K429 Umbilical hernia without obstruction or gangrene: Secondary | ICD-10-CM | POA: Diagnosis not present

## 2018-08-14 DIAGNOSIS — I10 Essential (primary) hypertension: Secondary | ICD-10-CM | POA: Diagnosis not present

## 2018-08-20 ENCOUNTER — Encounter: Payer: Self-pay | Admitting: Cardiovascular Disease

## 2018-08-20 ENCOUNTER — Ambulatory Visit (INDEPENDENT_AMBULATORY_CARE_PROVIDER_SITE_OTHER): Payer: Medicare Other | Admitting: Cardiovascular Disease

## 2018-08-20 VITALS — BP 182/86 | HR 81 | Ht 72.0 in | Wt 212.0 lb

## 2018-08-20 DIAGNOSIS — I10 Essential (primary) hypertension: Secondary | ICD-10-CM

## 2018-08-20 DIAGNOSIS — R0602 Shortness of breath: Secondary | ICD-10-CM

## 2018-08-20 DIAGNOSIS — E785 Hyperlipidemia, unspecified: Secondary | ICD-10-CM | POA: Diagnosis not present

## 2018-08-20 DIAGNOSIS — I25708 Atherosclerosis of coronary artery bypass graft(s), unspecified, with other forms of angina pectoris: Secondary | ICD-10-CM

## 2018-08-20 MED ORDER — METOPROLOL TARTRATE 25 MG PO TABS: 12.5000 mg | ORAL_TABLET | Freq: Two times a day (BID) | ORAL | 3 refills | Status: DC

## 2018-08-20 NOTE — Progress Notes (Signed)
SUBJECTIVE: The patient returns for follow-up after undergoing cardiovascular testing performed for the evaluation of exertional dyspnea.  Nuclear stress test on 05/22/2018 demonstrated prior myocardial infarction with peri-infarct ischemia in the mid inferoseptal, mid inferior and apical inferior regions, LVEF 70%.  It was deemed an intermediate risk study.  He underwent three-vessel CABG on 02/12/2009.  He underwent an abnormal nuclear stress test on 11/10/2015 but preferred to avoid cardiac catheterization at that time.  He denies chest pain and shortness of breath is stable.  His PCP has been evaluating him for swelling in his hands on the top of his feet.  An echocardiogram performed at [facility] on 08/02/2018 demonstrated normal left ventricular systolic function, EF 60 to 65%, normal right ventricular size and function, and no valvular issues.  He was found to be anemic, hemoglobin 10.1.  TSH was normal at 0.85.  Renal function was normal, BUN 9 and creatinine 0.84.      Review of Systems: As per "subjective", otherwise negative.  Allergies  Allergen Reactions  . Morphine     REACTION: dizziness    Current Outpatient Medications  Medication Sig Dispense Refill  . aspirin 81 MG tablet Take 1 tablet (81 mg total) by mouth every other day. 30 tablet   . Cholecalciferol (VITAMIN D) 2000 units CAPS Take 1 capsule by mouth daily.    . cyclobenzaprine (FLEXERIL) 10 MG tablet Take 10 mg by mouth 3 (three) times daily as needed for muscle spasms.    . ferrous gluconate (FERGON) 324 MG tablet Take 324 mg by mouth 3 (three) times daily with meals.    . fluticasone (FLONASE) 50 MCG/ACT nasal spray Place 1 spray into the nose at bedtime.     Marland Kitchen loratadine (CLARITIN) 10 MG tablet Take 10 mg by mouth daily.    . Multiple Vitamin (MULTIVITAMIN) tablet Take 1 tablet by mouth daily.      . nitroGLYCERIN (NITROSTAT) 0.4 MG SL tablet Place 1 tablet (0.4 mg total) under the tongue every  5 (five) minutes as needed. 25 tablet 3  . Omega-3 Fatty Acids (FISH OIL) 600 MG CAPS Take 600 mg by mouth daily.    Marland Kitchen oxyCODONE-acetaminophen (PERCOCET/ROXICET) 5-325 MG tablet Take 1 tablet by mouth every 4 (four) hours as needed for severe pain.    . pantoprazole (PROTONIX) 40 MG tablet Take 40 mg by mouth daily.      . rosuvastatin (CRESTOR) 20 MG tablet Take 20 mg by mouth daily with lunch.      No current facility-administered medications for this visit.     Past Medical History:  Diagnosis Date  . CAD (coronary artery disease) of artery bypass graft    1.Status post coronary artery bypass grafting. 2. Normal left ventricular function. 3. Increase left atrial dilatation.  . OA (osteoarthritis) of knee     Past Surgical History:  Procedure Laterality Date  . COLONOSCOPY N/A 08/03/2017   Procedure: COLONOSCOPY;  Surgeon: Rogene Houston, MD;  [facility];  Service: Endoscopy;  Laterality: N/A;  730  . CORONARY ARTERY BYPASS GRAFT    . FLEXIBLE SIGMOIDOSCOPY N/A 08/15/2017   Procedure: FLEXIBLE SIGMOIDOSCOPY;  Surgeon: Rogene Houston, MD;  Location: [facility];  Service: Endoscopy;  Laterality: N/A;  . JOINT REPLACEMENT Left 2012   Left knee  . KNEE ARTHROSCOPY      Social History   Socioeconomic History  . Marital status: Married    Spouse name: Not on  file  . Number of children: Not on file  . Years of education: Not on file  . Highest education level: Not on file  Occupational History  . Not on file  Social Needs  . Financial resource strain: Not on file  . Food insecurity:    Worry: Not on file    Inability: Not on file  . Transportation needs:    Medical: Not on file    Non-medical: Not on file  Tobacco Use  . Smoking status: Never Smoker  . Smokeless tobacco: Never Used  Substance and Sexual Activity  . Alcohol use: No    Alcohol/week: 0.0 standard drinks  . Drug use: No  . Sexual activity: Not on file  Lifestyle  . Physical activity:     Days per week: Not on file    Minutes per session: Not on file  . Stress: Not on file  Relationships  . Social connections:    Talks on phone: Not on file    Gets together: Not on file    Attends religious service: Not on file    Active member of club or organization: Not on file    Attends meetings of clubs or organizations: Not on file    Relationship status: Not on file  . Intimate partner violence:    Fear of current or ex partner: Not on file    Emotionally abused: Not on file    Physically abused: Not on file    Forced sexual activity: Not on file  Other Topics Concern  . Not on file  Social History Narrative  . Not on file     Vitals:   08/20/18 1013  BP: (!) 182/86  Pulse: 81  SpO2: 97%  Weight: 212 lb (96.2 kg)  Height: 6' (1.829 m)    Wt Readings from Last 3 Encounters:  08/20/18 212 lb (96.2 kg)  05/13/18 216 lb 3.2 oz (98.1 kg)  04/06/18 206 lb (93.4 kg)     PHYSICAL EXAM General: NAD HEENT: Normal. Neck: No JVD, no thyromegaly. Lungs: Clear to auscultation bilaterally with normal respiratory effort. CV: Regular rate and rhythm, normal S1/S2, no S3/S4, no murmur. No pretibial or periankle edema.  No carotid bruit.   Abdomen: Soft, nontender, no distention.  Neurologic: Alert and oriented.  Psych: Normal affect. Skin: Normal. Musculoskeletal: No gross deformities.    ECG: Reviewed above under Subjective   Labs: Lab Results  Component Value Date/Time   K 3.9 04/06/2018 10:43 AM   BUN 21 (H) 04/06/2018 10:43 AM   CREATININE 1.03 04/06/2018 10:43 AM   ALT 17 08/15/2017 05:27 PM   HGB 10.9 (L) 04/06/2018 10:43 AM     Lipids: No results found for: LDLCALC, LDLDIRECT, CHOL, TRIG, HDL     ASSESSMENT AND PLAN:  1. CAD with history of CABG with exertional dyspnea:  Symptomatically stable.  He denies a history of pulmonary disease.  CABG was 9 years ago.  Symptoms are primarily experienced when walking uphill.  Blood pressure is elevated  today but reportedly had been low when he was taking lisinopril which has since been discontinued.   Abnormal nuclear stress test in June 2019 as detailed above with infarction and peri-infarct ischemia in the inferior and adjacent walls. Continue aspirin and Crestor.  I will start Lopressor 12.5 mg twice daily.  2. Hypercholesterolemia: I reviewed lipids dated 09/14/2017-total cholesterol 121, triglycerides 67, HDL 37, LDL 71. Continue Crestor.  3. Essential AYT:KZSWF pressure is significantly elevated but was  reportedly low when taking lisinopril which has since been discontinued.  I will start Lopressor 12.5 mg twice daily.   Disposition: Follow up 6 months   Kate Sable, M.D., F.A.C.C.

## 2018-08-20 NOTE — Patient Instructions (Addendum)
Your physician wants you to follow-up in: 6 months With Dr.Koneswaran You will receive a reminder letter in the mail two months in advance. If you don't receive a letter, please call our office to schedule the follow-up appointment.   START Lopressor 12.5 mg twice a day     If you need a refill on your cardiac medications before your next appointment, please call your pharmacy.      No lab work or tests ordered today.      Thank you for choosing [facility] !

## 2018-09-06 DIAGNOSIS — E782 Mixed hyperlipidemia: Secondary | ICD-10-CM | POA: Diagnosis not present

## 2018-09-06 DIAGNOSIS — I25111 Atherosclerotic heart disease of native coronary artery with angina pectoris with documented spasm: Secondary | ICD-10-CM | POA: Diagnosis not present

## 2018-09-06 DIAGNOSIS — I10 Essential (primary) hypertension: Secondary | ICD-10-CM | POA: Diagnosis not present

## 2018-09-06 DIAGNOSIS — D649 Anemia, unspecified: Secondary | ICD-10-CM | POA: Diagnosis not present

## 2018-09-06 DIAGNOSIS — Z9189 Other specified personal risk factors, not elsewhere classified: Secondary | ICD-10-CM | POA: Diagnosis not present

## 2018-09-06 DIAGNOSIS — K219 Gastro-esophageal reflux disease without esophagitis: Secondary | ICD-10-CM | POA: Diagnosis not present

## 2018-09-06 DIAGNOSIS — Z79891 Long term (current) use of opiate analgesic: Secondary | ICD-10-CM | POA: Diagnosis not present

## 2018-09-11 DIAGNOSIS — Z6828 Body mass index (BMI) 28.0-28.9, adult: Secondary | ICD-10-CM | POA: Diagnosis not present

## 2018-09-11 DIAGNOSIS — I10 Essential (primary) hypertension: Secondary | ICD-10-CM | POA: Diagnosis not present

## 2018-09-11 DIAGNOSIS — Z0001 Encounter for general adult medical examination with abnormal findings: Secondary | ICD-10-CM | POA: Diagnosis not present

## 2018-09-18 DIAGNOSIS — R509 Fever, unspecified: Secondary | ICD-10-CM | POA: Diagnosis not present

## 2018-09-18 DIAGNOSIS — J020 Streptococcal pharyngitis: Secondary | ICD-10-CM | POA: Diagnosis not present

## 2018-10-15 ENCOUNTER — Telehealth: Payer: Self-pay | Admitting: Cardiovascular Disease

## 2018-10-15 MED ORDER — NEBIVOLOL HCL 5 MG PO TABS: 5.0000 mg | ORAL_TABLET | Freq: Every day | ORAL | 3 refills | Status: DC

## 2018-10-15 NOTE — Telephone Encounter (Signed)
Pt will stop lopressor, start bystolic 5 mg daily

## 2018-10-15 NOTE — Telephone Encounter (Signed)
metoprolol tartrate (LOPRESSOR) 25 MG tablet  Patient walked into [facility]  - stated that rash started about two weeks ago that comes up across his chest and forehead. Was told by his pharmacy that another medication could be prescribed that may not cause him to break out with rash

## 2018-10-15 NOTE — Telephone Encounter (Signed)
Will forward to Dr. Koneswaran to advise.  

## 2018-10-15 NOTE — Telephone Encounter (Signed)
Try Bystolic 5 mg daily instead (stop Lopressor).

## 2018-10-23 ENCOUNTER — Other Ambulatory Visit: Payer: Self-pay

## 2018-10-30 DIAGNOSIS — D649 Anemia, unspecified: Secondary | ICD-10-CM | POA: Diagnosis not present

## 2018-10-30 DIAGNOSIS — D519 Vitamin B12 deficiency anemia, unspecified: Secondary | ICD-10-CM | POA: Diagnosis not present

## 2018-10-30 DIAGNOSIS — D529 Folate deficiency anemia, unspecified: Secondary | ICD-10-CM | POA: Diagnosis not present

## 2018-11-01 DIAGNOSIS — Z1211 Encounter for screening for malignant neoplasm of colon: Secondary | ICD-10-CM | POA: Diagnosis not present

## 2018-12-11 DIAGNOSIS — D649 Anemia, unspecified: Secondary | ICD-10-CM | POA: Diagnosis not present

## 2018-12-11 DIAGNOSIS — D529 Folate deficiency anemia, unspecified: Secondary | ICD-10-CM | POA: Diagnosis not present

## 2018-12-11 DIAGNOSIS — D519 Vitamin B12 deficiency anemia, unspecified: Secondary | ICD-10-CM | POA: Diagnosis not present

## 2019-01-08 DIAGNOSIS — E1165 Type 2 diabetes mellitus with hyperglycemia: Secondary | ICD-10-CM | POA: Diagnosis not present

## 2019-01-08 DIAGNOSIS — I10 Essential (primary) hypertension: Secondary | ICD-10-CM | POA: Diagnosis not present

## 2019-01-08 DIAGNOSIS — D649 Anemia, unspecified: Secondary | ICD-10-CM | POA: Diagnosis not present

## 2019-01-08 DIAGNOSIS — E782 Mixed hyperlipidemia: Secondary | ICD-10-CM | POA: Diagnosis not present

## 2019-01-08 DIAGNOSIS — D529 Folate deficiency anemia, unspecified: Secondary | ICD-10-CM | POA: Diagnosis not present

## 2019-01-08 DIAGNOSIS — D519 Vitamin B12 deficiency anemia, unspecified: Secondary | ICD-10-CM | POA: Diagnosis not present

## 2019-01-15 DIAGNOSIS — I25111 Atherosclerotic heart disease of native coronary artery with angina pectoris with documented spasm: Secondary | ICD-10-CM | POA: Diagnosis not present

## 2019-01-15 DIAGNOSIS — Z79891 Long term (current) use of opiate analgesic: Secondary | ICD-10-CM | POA: Diagnosis not present

## 2019-01-15 DIAGNOSIS — I10 Essential (primary) hypertension: Secondary | ICD-10-CM | POA: Diagnosis not present

## 2019-01-15 DIAGNOSIS — E782 Mixed hyperlipidemia: Secondary | ICD-10-CM | POA: Diagnosis not present

## 2019-01-15 DIAGNOSIS — N400 Enlarged prostate without lower urinary tract symptoms: Secondary | ICD-10-CM | POA: Diagnosis not present

## 2019-01-15 DIAGNOSIS — D649 Anemia, unspecified: Secondary | ICD-10-CM | POA: Diagnosis not present

## 2019-01-15 DIAGNOSIS — Z6827 Body mass index (BMI) 27.0-27.9, adult: Secondary | ICD-10-CM | POA: Diagnosis not present

## 2019-01-15 DIAGNOSIS — K429 Umbilical hernia without obstruction or gangrene: Secondary | ICD-10-CM | POA: Diagnosis not present

## 2019-01-16 ENCOUNTER — Telehealth: Payer: Self-pay | Admitting: Cardiovascular Disease

## 2019-01-16 MED ORDER — CARVEDILOL 3.125 MG PO TABS: 3.1250 mg | ORAL_TABLET | Freq: Two times a day (BID) | ORAL | 3 refills | Status: DC

## 2019-01-16 NOTE — Telephone Encounter (Signed)
Pt has stopped taking his nebivolol (BYSTOLIC) 5 MG tablet [[id number]]  Because it was making him break out and caused a rash on his ear down his to face.

## 2019-01-16 NOTE — Telephone Encounter (Signed)
Sent to Dr. Bronson Ing as an FYI:

## 2019-01-16 NOTE — Telephone Encounter (Signed)
Try carvedilol 3.125 mg twice daily.

## 2019-01-16 NOTE — Telephone Encounter (Signed)
Pt made aware. Sent in RX to [facility].

## 2019-01-30 DIAGNOSIS — H2513 Age-related nuclear cataract, bilateral: Secondary | ICD-10-CM | POA: Diagnosis not present

## 2019-02-20 ENCOUNTER — Encounter: Payer: Self-pay | Admitting: Cardiovascular Disease

## 2019-02-20 ENCOUNTER — Other Ambulatory Visit: Payer: Self-pay

## 2019-02-20 ENCOUNTER — Ambulatory Visit (INDEPENDENT_AMBULATORY_CARE_PROVIDER_SITE_OTHER): Payer: Medicare Other | Admitting: Cardiovascular Disease

## 2019-02-20 VITALS — BP 150/84 | HR 64 | Temp 97.8°F | Ht 72.0 in | Wt 208.6 lb

## 2019-02-20 DIAGNOSIS — R0602 Shortness of breath: Secondary | ICD-10-CM | POA: Diagnosis not present

## 2019-02-20 DIAGNOSIS — I10 Essential (primary) hypertension: Secondary | ICD-10-CM | POA: Diagnosis not present

## 2019-02-20 DIAGNOSIS — E785 Hyperlipidemia, unspecified: Secondary | ICD-10-CM | POA: Diagnosis not present

## 2019-02-20 DIAGNOSIS — I25708 Atherosclerosis of coronary artery bypass graft(s), unspecified, with other forms of angina pectoris: Secondary | ICD-10-CM | POA: Diagnosis not present

## 2019-02-20 MED ORDER — CARVEDILOL 6.25 MG PO TABS: 6.2500 mg | ORAL_TABLET | Freq: Two times a day (BID) | ORAL | 3 refills | Status: DC

## 2019-02-20 NOTE — Addendum Note (Signed)
Addended by: Levonne Hubert on: 02/20/2019 10:17 AM   Modules accepted: Orders

## 2019-02-20 NOTE — Patient Instructions (Signed)
Medication Instructions:  Your physician has recommended you make the following change in your medication:  Increase Coreg to 6.25 mg Two Times Daily   If you need a refill on your cardiac medications before your next appointment, please call your pharmacy.   Lab work: NONE  If you have labs (blood work) drawn today and your tests are completely normal, you will receive your results only by: Marland Kitchen MyChart Message (if you have MyChart) OR . A paper copy in the mail If you have any lab test that is abnormal or we need to change your treatment, we will call you to review the results.  Testing/Procedures: NONE   Follow-Up: At [facility], you and your health needs are our priority.  As part of our continuing mission to provide you with exceptional heart care, we have created designated Provider Care Teams.  These Care Teams include your primary Cardiologist (physician) and Advanced Practice Providers (APPs -  Physician Assistants and Nurse Practitioners) who all work together to provide you with the care you need, when you need it. You will need a follow up appointment in 6 months.  Please call our office 2 months in advance to schedule this appointment.  You may see Kate Sable, MD or one of the following Advanced Practice Providers on your designated Care Team:   Bernerd Pho, PA-C [facility] [place]) . Ermalinda Barrios, PA-C ([place])  Any Other Special Instructions Will Be Listed Below (If Applicable). Thank you for choosing [facility]!

## 2019-02-20 NOTE — Progress Notes (Signed)
SUBJECTIVE: The patient presents for routine follow-up.  Nebivolol caused him to break out in a facial rash.  He had previously developed a rash with Lopressor.  I switched him to carvedilol.  In summary, he has a history of three-vessel CABG on 02/12/2009. on 02/12/2009.  He underwent an abnormal nuclear stress test on 11/10/2015 but preferred to avoid cardiac catheterization at that time.  Nuclear stress test on 05/22/2018 demonstrated prior myocardial infarction with peri-infarct ischemia in the mid inferoseptal, mid inferior and apical inferior regions, LVEF 70%.  It was deemed an intermediate risk study.  An echocardiogram performed at [facility] on 08/02/2018 demonstrated normal left ventricular systolic function, EF 60 to 65%, normal right ventricular size and function, and no valvular issues.  He denies chest pain, palpitations, leg swelling, lightheadedness, dizziness.  Chronic exertional dyspnea stable.  He said this is been no different since prior to and after bypass surgery.    Review of Systems: As per "subjective", otherwise negative.  Allergies  Allergen Reactions  . Morphine     REACTION: dizziness    Current Outpatient Medications  Medication Sig Dispense Refill  . aspirin 81 MG tablet Take 1 tablet (81 mg total) by mouth every other day. 30 tablet   . carvedilol (COREG) 3.125 MG tablet Take 1 tablet (3.125 mg total) by mouth 2 (two) times daily. 180 tablet 3  . Cholecalciferol (VITAMIN D) 2000 units CAPS Take 1 capsule by mouth daily.    . cyclobenzaprine (FLEXERIL) 10 MG tablet Take 10 mg by mouth 3 (three) times daily as needed for muscle spasms.    . ferrous gluconate (FERGON) 324 MG tablet Take 324 mg by mouth 3 (three) times daily with meals.    . fluticasone (FLONASE) 50 MCG/ACT nasal spray Place 1 spray into the nose at bedtime.     Marland Kitchen loratadine (CLARITIN) 10 MG tablet Take 10 mg by mouth daily.    . Multiple Vitamin (MULTIVITAMIN) tablet Take 1  tablet by mouth daily.      . nitroGLYCERIN (NITROSTAT) 0.4 MG SL tablet Place 1 tablet (0.4 mg total) under the tongue every 5 (five) minutes as needed. 25 tablet 3  . Omega-3 Fatty Acids (FISH OIL) 600 MG CAPS Take 600 mg by mouth daily.    Marland Kitchen oxyCODONE-acetaminophen (PERCOCET/ROXICET) 5-325 MG tablet Take 1 tablet by mouth every 4 (four) hours as needed for severe pain.    . pantoprazole (PROTONIX) 40 MG tablet Take 40 mg by mouth daily.      . rosuvastatin (CRESTOR) 20 MG tablet Take 20 mg by mouth daily with lunch.      No current facility-administered medications for this visit.     Past Medical History:  Diagnosis Date  . CAD (coronary artery disease) of artery bypass graft    1.Status post coronary artery bypass grafting. 2. Normal left ventricular function. 3. Increase left atrial dilatation.  . OA (osteoarthritis) of knee     Past Surgical History:  Procedure Laterality Date  . COLONOSCOPY N/A 08/03/2017   Procedure: COLONOSCOPY;  Surgeon: Rogene Houston, MD;  [facility];  Service: Endoscopy;  Laterality: N/A;  730  . CORONARY ARTERY BYPASS GRAFT    . FLEXIBLE SIGMOIDOSCOPY N/A 08/15/2017   Procedure: FLEXIBLE SIGMOIDOSCOPY;  Surgeon: Rogene Houston, MD;  [facility];  Service: Endoscopy;  Laterality: N/A;  . JOINT REPLACEMENT Left 2012   Left knee  . KNEE ARTHROSCOPY  Social History   Socioeconomic History  . Marital status: Married    Spouse name: Not on file  . Number of children: Not on file  . Years of education: Not on file  . Highest education level: Not on file  Occupational History  . Not on file  Social Needs  . Financial resource strain: Not on file  . Food insecurity:    Worry: Not on file    Inability: Not on file  . Transportation needs:    Medical: Not on file    Non-medical: Not on file  Tobacco Use  . Smoking status: Never Smoker  . Smokeless tobacco: Never Used  Substance and Sexual Activity  . Alcohol use:  No    Alcohol/week: 0.0 standard drinks  . Drug use: No  . Sexual activity: Not on file  Lifestyle  . Physical activity:    Days per week: Not on file    Minutes per session: Not on file  . Stress: Not on file  Relationships  . Social connections:    Talks on phone: Not on file    Gets together: Not on file    Attends religious service: Not on file    Active member of club or organization: Not on file    Attends meetings of clubs or organizations: Not on file    Relationship status: Not on file  . Intimate partner violence:    Fear of current or ex partner: Not on file    Emotionally abused: Not on file    Physically abused: Not on file    Forced sexual activity: Not on file  Other Topics Concern  . Not on file  Social History Narrative  . Not on file     Vitals:   02/20/19 0928  BP: (!) 150/84  Pulse: 64  Temp: 97.8 F (36.6 C)  SpO2: 97%  Weight: 208 lb 9.6 oz (94.6 kg)  Height: 6' (1.829 m)    Wt Readings from Last 3 Encounters:  02/20/19 208 lb 9.6 oz (94.6 kg)  08/20/18 212 lb (96.2 kg)  05/13/18 216 lb 3.2 oz (98.1 kg)     PHYSICAL EXAM General: NAD HEENT: Normal. Neck: No JVD, no thyromegaly. Lungs: Clear to auscultation bilaterally with normal respiratory effort. CV: Regular rate and rhythm, normal S1/S2, no S3/S4, no murmur. No pretibial or periankle edema.  No carotid bruit.   Abdomen: Soft, nontender, no distention.  Neurologic: Alert and oriented.  Psych: Normal affect. Skin: Normal. Musculoskeletal: No gross deformities.    ECG: Reviewed above under Subjective   Labs: Lab Results  Component Value Date/Time   K 3.9 04/06/2018 10:43 AM   BUN 21 (H) 04/06/2018 10:43 AM   CREATININE 1.03 04/06/2018 10:43 AM   ALT 17 08/15/2017 05:27 PM   HGB 10.9 (L) 04/06/2018 10:43 AM     Lipids: No results found for: LDLCALC, LDLDIRECT, CHOL, TRIG, HDL     ASSESSMENT AND PLAN: 1. CAD: History of CABG in 2010.  He had been experiencing  exertional dyspnea but symptoms are currently stable.  He denies a history of pulmonary disease. CABG was  years ago. Symptoms are primarily experienced when walking uphill. Blood pressure is elevated today but reportedly had been low when he was taking lisinopril which has since been discontinued.  Abnormal nuclear stress test in June 2019 as detailed above with infarction and peri-infarct ischemia in the inferior and adjacent walls. Continue aspirin and Crestor.  I will increase carvedilol to 6.25  mg twice daily.  2. Hypercholesterolemia:Lipid panel on 01/08/2019 showed total cholesterol 94, triglycerides 55, HDL 34, LDL 49.Marland Kitchen  Continue Crestor 20 mg.  3. Essential ITU:YWXIP pressure is significantly elevated but was reportedly low when taking lisinopril which has since been discontinued.  I will increase carvedilol to 6.25 mg twice daily.    Disposition: Follow up 6 months   Kate Sable, M.D., F.A.C.C.

## 2019-05-14 DIAGNOSIS — I10 Essential (primary) hypertension: Secondary | ICD-10-CM | POA: Diagnosis not present

## 2019-05-14 DIAGNOSIS — E782 Mixed hyperlipidemia: Secondary | ICD-10-CM | POA: Diagnosis not present

## 2019-05-14 DIAGNOSIS — D649 Anemia, unspecified: Secondary | ICD-10-CM | POA: Diagnosis not present

## 2019-05-14 DIAGNOSIS — R202 Paresthesia of skin: Secondary | ICD-10-CM | POA: Diagnosis not present

## 2019-05-21 DIAGNOSIS — I10 Essential (primary) hypertension: Secondary | ICD-10-CM | POA: Diagnosis not present

## 2019-05-21 DIAGNOSIS — I25111 Atherosclerotic heart disease of native coronary artery with angina pectoris with documented spasm: Secondary | ICD-10-CM | POA: Diagnosis not present

## 2019-05-21 DIAGNOSIS — E782 Mixed hyperlipidemia: Secondary | ICD-10-CM | POA: Diagnosis not present

## 2019-05-21 DIAGNOSIS — Z1389 Encounter for screening for other disorder: Secondary | ICD-10-CM | POA: Diagnosis not present

## 2019-05-21 DIAGNOSIS — Z79891 Long term (current) use of opiate analgesic: Secondary | ICD-10-CM | POA: Diagnosis not present

## 2019-05-21 DIAGNOSIS — Z6827 Body mass index (BMI) 27.0-27.9, adult: Secondary | ICD-10-CM | POA: Diagnosis not present

## 2019-05-21 DIAGNOSIS — J301 Allergic rhinitis due to pollen: Secondary | ICD-10-CM | POA: Diagnosis not present

## 2019-05-21 DIAGNOSIS — K429 Umbilical hernia without obstruction or gangrene: Secondary | ICD-10-CM | POA: Diagnosis not present

## 2019-09-03 DIAGNOSIS — E785 Hyperlipidemia, unspecified: Secondary | ICD-10-CM | POA: Diagnosis not present

## 2019-09-03 DIAGNOSIS — I10 Essential (primary) hypertension: Secondary | ICD-10-CM | POA: Diagnosis not present

## 2019-09-03 DIAGNOSIS — E1165 Type 2 diabetes mellitus with hyperglycemia: Secondary | ICD-10-CM | POA: Diagnosis not present

## 2019-09-17 DIAGNOSIS — E782 Mixed hyperlipidemia: Secondary | ICD-10-CM | POA: Diagnosis not present

## 2019-09-17 DIAGNOSIS — I10 Essential (primary) hypertension: Secondary | ICD-10-CM | POA: Diagnosis not present

## 2019-09-17 DIAGNOSIS — D529 Folate deficiency anemia, unspecified: Secondary | ICD-10-CM | POA: Diagnosis not present

## 2019-09-17 DIAGNOSIS — D649 Anemia, unspecified: Secondary | ICD-10-CM | POA: Diagnosis not present

## 2019-09-17 DIAGNOSIS — E1165 Type 2 diabetes mellitus with hyperglycemia: Secondary | ICD-10-CM | POA: Diagnosis not present

## 2019-09-17 DIAGNOSIS — K219 Gastro-esophageal reflux disease without esophagitis: Secondary | ICD-10-CM | POA: Diagnosis not present

## 2019-09-17 DIAGNOSIS — D519 Vitamin B12 deficiency anemia, unspecified: Secondary | ICD-10-CM | POA: Diagnosis not present

## 2019-09-24 DIAGNOSIS — Z79891 Long term (current) use of opiate analgesic: Secondary | ICD-10-CM | POA: Diagnosis not present

## 2019-09-24 DIAGNOSIS — N400 Enlarged prostate without lower urinary tract symptoms: Secondary | ICD-10-CM | POA: Diagnosis not present

## 2019-09-24 DIAGNOSIS — D649 Anemia, unspecified: Secondary | ICD-10-CM | POA: Diagnosis not present

## 2019-09-24 DIAGNOSIS — Z9189 Other specified personal risk factors, not elsewhere classified: Secondary | ICD-10-CM | POA: Diagnosis not present

## 2019-09-24 DIAGNOSIS — J301 Allergic rhinitis due to pollen: Secondary | ICD-10-CM | POA: Diagnosis not present

## 2019-09-24 DIAGNOSIS — K219 Gastro-esophageal reflux disease without esophagitis: Secondary | ICD-10-CM | POA: Diagnosis not present

## 2019-09-24 DIAGNOSIS — I10 Essential (primary) hypertension: Secondary | ICD-10-CM | POA: Diagnosis not present

## 2019-09-24 DIAGNOSIS — I25111 Atherosclerotic heart disease of native coronary artery with angina pectoris with documented spasm: Secondary | ICD-10-CM | POA: Diagnosis not present

## 2019-09-24 DIAGNOSIS — E782 Mixed hyperlipidemia: Secondary | ICD-10-CM | POA: Diagnosis not present

## 2019-09-24 DIAGNOSIS — R4582 Worries: Secondary | ICD-10-CM | POA: Diagnosis not present

## 2019-09-24 DIAGNOSIS — Z0001 Encounter for general adult medical examination with abnormal findings: Secondary | ICD-10-CM | POA: Diagnosis not present

## 2019-09-24 DIAGNOSIS — M1711 Unilateral primary osteoarthritis, right knee: Secondary | ICD-10-CM | POA: Diagnosis not present

## 2019-09-24 DIAGNOSIS — Z6826 Body mass index (BMI) 26.0-26.9, adult: Secondary | ICD-10-CM | POA: Diagnosis not present

## 2019-09-24 DIAGNOSIS — K429 Umbilical hernia without obstruction or gangrene: Secondary | ICD-10-CM | POA: Diagnosis not present

## 2019-10-03 DIAGNOSIS — I10 Essential (primary) hypertension: Secondary | ICD-10-CM | POA: Diagnosis not present

## 2019-10-03 DIAGNOSIS — E782 Mixed hyperlipidemia: Secondary | ICD-10-CM | POA: Diagnosis not present

## 2019-11-10 ENCOUNTER — Telehealth: Payer: Self-pay | Admitting: Cardiovascular Disease

## 2019-11-10 DIAGNOSIS — I10 Essential (primary) hypertension: Secondary | ICD-10-CM

## 2019-11-10 DIAGNOSIS — Z79899 Other long term (current) drug therapy: Secondary | ICD-10-CM

## 2019-11-10 NOTE — Telephone Encounter (Signed)
That BP is actually good. What did he mean by it dropping? I would have him get an ECG and a nurse visit. He may need some med adjustments. I would also check BMET, TSH and CBC.

## 2019-11-10 NOTE — Telephone Encounter (Signed)
Please call [phone] ask for [facility]

## 2019-11-10 NOTE — Telephone Encounter (Signed)
Patient's wife -Mariann Laster called stating that patient's pulse continues to stay elevated. States that he has questions about his medication.. Please call Mariann [phone].

## 2019-11-10 NOTE — Telephone Encounter (Signed)
Spoke with pt and wife. Pt states that for the last 2 weeks he has been taking Coreg on and off. He reports that when he takes his coreg he feels dizzy and his BP drops and according to home monitor pt  HR ranges in the 120's. Pt only takes one coreg on the days that he does take medication. Please advise.

## 2019-11-10 NOTE — Telephone Encounter (Addendum)
Pt took Coreg 6.25 mg at 1:30 pm on 11/09/19 and at 2:30 his BP was 139/79 with HR 125.

## 2019-11-10 NOTE — Telephone Encounter (Signed)
When he says that BP drops, what is his blood pressure after taking carvedilol?  He has been intolerant of nebivolol and Lopressor in the past.  He was previously tolerating 3.125 mg twice daily of carvedilol.  When he takes carvedilol, has he been taking 6.25 mg?

## 2019-11-10 NOTE — Telephone Encounter (Signed)
Returned call to wife. No answer. Left msg to call back.  

## 2019-11-11 ENCOUNTER — Ambulatory Visit (INDEPENDENT_AMBULATORY_CARE_PROVIDER_SITE_OTHER): Payer: Medicare Other | Admitting: *Deleted

## 2019-11-11 ENCOUNTER — Other Ambulatory Visit: Payer: Self-pay

## 2019-11-11 ENCOUNTER — Other Ambulatory Visit (HOSPITAL_COMMUNITY)
Admission: RE | Admit: 2019-11-11 | Discharge: 2019-11-11 | Disposition: A | Discharge status: Home / Self Care | Payer: Medicare Other | Source: Ambulatory Visit | Attending: Cardiovascular Disease | Admitting: Cardiovascular Disease

## 2019-11-11 VITALS — BP 149/99 | HR 130 | Temp 97.9°F | Ht 72.0 in | Wt 198.0 lb

## 2019-11-11 DIAGNOSIS — R42 Dizziness and giddiness: Secondary | ICD-10-CM | POA: Diagnosis not present

## 2019-11-11 DIAGNOSIS — I10 Essential (primary) hypertension: Secondary | ICD-10-CM | POA: Diagnosis not present

## 2019-11-11 DIAGNOSIS — Z79899 Other long term (current) drug therapy: Secondary | ICD-10-CM | POA: Insufficient documentation

## 2019-11-11 LAB — CBC
HCT: 40.4 % (ref 39.0–52.0)
Hemoglobin: 12.9 g/dL — ABNORMAL LOW (ref 13.0–17.0)
MCH: 28.8 pg (ref 26.0–34.0)
MCHC: 31.9 g/dL (ref 30.0–36.0)
MCV: 90.2 fL (ref 80.0–100.0)
Platelets: 198 10*3/uL (ref 150–400)
RBC: 4.48 MIL/uL (ref 4.22–5.81)
RDW: 14.6 % (ref 11.5–15.5)
WBC: 5.4 10*3/uL (ref 4.0–10.5)
nRBC: 0 % (ref 0.0–0.2)

## 2019-11-11 LAB — TSH: TSH: 1.281 u[IU]/mL (ref 0.350–4.500)

## 2019-11-11 LAB — BASIC METABOLIC PANEL
Anion gap: 10 (ref 5–15)
BUN: 17 mg/dL (ref 8–23)
CO2: 24 mmol/L (ref 22–32)
Calcium: 8.8 mg/dL — ABNORMAL LOW (ref 8.9–10.3)
Chloride: 103 mmol/L (ref 98–111)
Creatinine, Ser: 0.91 mg/dL (ref 0.61–1.24)
GFR calc Af Amer: >60 mL/min (ref >60)
GFR calc non Af Amer: >60 mL/min (ref >60)
Glucose, Bld: 102 mg/dL — ABNORMAL HIGH (ref 70–99)
Potassium: 3.9 mmol/L (ref 3.5–5.1)
Sodium: 137 mmol/L (ref 135–145)

## 2019-11-11 MED ORDER — DILTIAZEM HCL 30 MG PO TABS: 30.0000 mg | ORAL_TABLET | Freq: Two times a day (BID) | ORAL | 11 refills | Status: DC

## 2019-11-11 NOTE — Patient Instructions (Signed)
Medication Instructions:  Your physician has recommended you make the following change in your medication:  Start Diltizem 30 mg Two Times Daily  Stop Taking Coreg    Labwork: NONE   Testing/Procedures: NONE   Follow-Up: As Planned   Any Other Special Instructions Will Be Listed Below (If Applicable).     If you need a refill on your cardiac medications before your next appointment, please call your pharmacy.  Thank you for choosing [facility]!

## 2019-11-11 NOTE — Telephone Encounter (Signed)
Pt and wife notified, appt made, orders placed.

## 2019-11-12 ENCOUNTER — Telehealth: Payer: Self-pay | Admitting: *Deleted

## 2019-11-12 NOTE — Telephone Encounter (Signed)
Wife called and states that pt c/o decreased BP, elevated pulse and headache. Reports BP 110/58 and HR of 127. Please advise.

## 2019-11-13 ENCOUNTER — Telehealth (INDEPENDENT_AMBULATORY_CARE_PROVIDER_SITE_OTHER): Payer: Medicare Other | Admitting: Cardiovascular Disease

## 2019-11-13 ENCOUNTER — Encounter: Payer: Self-pay | Admitting: Cardiovascular Disease

## 2019-11-13 VITALS — BP 147/83 | HR 77 | Ht 72.0 in | Wt 198.0 lb

## 2019-11-13 DIAGNOSIS — R000 Tachycardia, unspecified: Secondary | ICD-10-CM

## 2019-11-13 DIAGNOSIS — I10 Essential (primary) hypertension: Secondary | ICD-10-CM

## 2019-11-13 DIAGNOSIS — E785 Hyperlipidemia, unspecified: Secondary | ICD-10-CM

## 2019-11-13 DIAGNOSIS — I25708 Atherosclerosis of coronary artery bypass graft(s), unspecified, with other forms of angina pectoris: Secondary | ICD-10-CM

## 2019-11-13 NOTE — Patient Instructions (Signed)
Medication Instructions:  Your physician recommends that you continue on your current medications as directed. Please refer to the Current Medication list given to you today.  *If you need a refill on your cardiac medications before your next appointment, please call your pharmacy*  Lab Work: NONE   If you have labs (blood work) drawn today and your tests are completely normal, you will receive your results only by: Marland Kitchen MyChart Message (if you have MyChart) OR . A paper copy in the mail If you have any lab test that is abnormal or we need to change your treatment, we will call you to review the results.  Testing/Procedures: Your physician has recommended that you wear an event monitor. Event monitors are medical devices that record the heart's electrical activity. Doctors most often [place] these monitors to diagnose arrhythmias. Arrhythmias are problems with the speed or rhythm of the heartbeat. The monitor is a small, portable device. You can wear one while you do your normal daily activities. This is usually used to diagnose what is causing palpitations/syncope (passing out).   Follow-Up: At [facility], you and your health needs are our priority.  As part of our continuing mission to provide you with exceptional heart care, we have created designated Provider Care Teams.  These Care Teams include your primary Cardiologist (physician) and Advanced Practice Providers (APPs -  Physician Assistants and Nurse Practitioners) who all work together to provide you with the care you need, when you need it.  Your next appointment:   3 month(s)  The format for your next appointment:   In Person  Provider:   Bernerd Pho, PA-C  Other Instructions Thank you for choosing [facility]!

## 2019-11-13 NOTE — Telephone Encounter (Signed)
Wife has called and left a message this morning requesting someone to please call them.   States pt has started to sweat

## 2019-11-13 NOTE — Addendum Note (Signed)
Addended by: Levonne Hubert on: 11/13/2019 12:23 PM   Modules accepted: Orders

## 2019-11-13 NOTE — Telephone Encounter (Signed)
This was after taking diltiazem? That BP isn't low (still normal range).

## 2019-11-13 NOTE — Telephone Encounter (Signed)
Pt had VV with Dr. Bronson Ing today.

## 2019-11-13 NOTE — Progress Notes (Signed)
Virtual Visit via Telephone Note   This visit type was conducted due to national recommendations for restrictions regarding the COVID-19 Pandemic (e.g. social distancing) in an effort to limit this patient's exposure and mitigate transmission in our community.  Due to his co-morbid illnesses, this patient is at least at moderate risk for complications without adequate follow up.  This format is felt to be most appropriate for this patient at this time.  The patient did not have access to video technology/had technical difficulties with video requiring transitioning to audio format only (telephone).  All issues noted in this document were discussed and addressed.  No physical exam could be performed with this format.  Please refer to the patient's chart for his  consent to telehealth for [facility].   Date:  11/13/2019   ID:  Jarmel, Kmetz 08-07-1941, MRN [id number]  Patient Location: Home Provider Location: Office  PCP:  Caryl Bis, MD  Cardiologist:  Kate Sable, MD  Electrophysiologist:  None   Evaluation Performed:  Follow-Up Visit  Chief Complaint:  CAD  History of Present Illness:    [name] [name] is a 78 y.o. male with coronary artery disease and three-vessel CABG on 02/12/2009.  He has had issues with tachycardia and has been intolerant of nebivolol, Lopressor and carvedilol.  Nebivolol and Lopressor caused rashes.  I recently attempted diltiazem 30 mg twice daily.  He says his blood pressure drops with systolic readings in the [id number] range.  I recently checked several labs with pertinent findings being hemoglobin of 12.9, normal TSH and basic metabolic panel.  He has been having issues with tachycardia. Yesterday it was 125-137 bpm.  This morning it was 125 bpm. Now it is down to 72 bpm.  He woke up sweating. He has occasional headaches. He denies chest pain.   Symptoms prior to CABG involved shortness of breath.  He has occasional but mild shortness of  breath.  He drinks one cup of decaf coffee every morning and otherwise drinks water throughout the day.   Past Medical History:  Diagnosis Date  . CAD (coronary artery disease) of artery bypass graft    1.Status post coronary artery bypass grafting. 2. Normal left ventricular function. 3. Increase left atrial dilatation.  . OA (osteoarthritis) of knee    Past Surgical History:  Procedure Laterality Date  . COLONOSCOPY N/A 08/03/2017   Procedure: COLONOSCOPY;  Surgeon: Rogene Houston, MD;  [facility];  Service: Endoscopy;  Laterality: N/A;  730  . CORONARY ARTERY BYPASS GRAFT    . FLEXIBLE SIGMOIDOSCOPY N/A 08/15/2017   Procedure: FLEXIBLE SIGMOIDOSCOPY;  Surgeon: Rogene Houston, MD;  Location: [facility];  Service: Endoscopy;  Laterality: N/A;  . JOINT REPLACEMENT Left 2012   Left knee  . KNEE ARTHROSCOPY       Current Meds  Medication Sig  . aspirin 81 MG tablet Take 1 tablet (81 mg total) by mouth every other day.  . Cholecalciferol (VITAMIN D) 2000 units CAPS Take 1 capsule by mouth daily.  Marland Kitchen diltiazem (CARDIZEM) 30 MG tablet Take 1 tablet (30 mg total) by mouth 2 (two) times daily.  . ferrous gluconate (FERGON) 324 MG tablet Take 324 mg by mouth 3 (three) times daily with meals.  . fluticasone (FLONASE) 50 MCG/ACT nasal spray Place 1 spray into the nose at bedtime.   Marland Kitchen loratadine (CLARITIN) 10 MG tablet Take 10 mg by mouth daily.  . Multiple Vitamin (MULTIVITAMIN) tablet Take 1  tablet by mouth daily.    . nitroGLYCERIN (NITROSTAT) 0.4 MG SL tablet Place 1 tablet (0.4 mg total) under the tongue every 5 (five) minutes as needed.  . Omega-3 Fatty Acids (FISH OIL) 600 MG CAPS Take 600 mg by mouth daily.  Marland Kitchen oxyCODONE-acetaminophen (PERCOCET/ROXICET) 5-325 MG tablet Take 1 tablet by mouth every 4 (four) hours as needed for severe pain.  . pantoprazole (PROTONIX) 40 MG tablet Take 40 mg by mouth daily.    . rosuvastatin (CRESTOR) 20 MG tablet Take 20 mg by mouth  daily with lunch.      Allergies:   Morphine   Social History   Tobacco Use  . Smoking status: Never Smoker  . Smokeless tobacco: Never Used  Substance Use Topics  . Alcohol use: No    Alcohol/week: 0.0 standard drinks  . Drug use: No     Family Hx: The patient's family history includes Coronary artery disease in his father, mother, and another family member; Heart attack in his father.  ROS:   Please see the history of present illness.     All other systems reviewed and are negative.   Prior CV studies:   The following studies were reviewed today:  Nuclear stress test on 05/22/2018 demonstrated prior myocardial infarction with peri-infarct ischemia in the mid inferoseptal, mid inferior and apical inferior regions, LVEF 70%. It was deemed an intermediate risk study.  An echocardiogram performed at [facility] on 08/02/2018 demonstrated normal left ventricular systolic function, EF 60 to 65%, normal right ventricular size and function, and no valvular issues.  Labs/Other Tests and Data Reviewed:    EKG:  An ECG dated 11/11/2019 was personally reviewed today and demonstrated:  Probable sinus tachycardia, 121 bpm.  Recent Labs: 11/11/2019: BUN 17; Creatinine, Ser 0.91; Hemoglobin 12.9; Platelets 198; Potassium 3.9; Sodium 137; TSH 1.281   Recent Lipid Panel No results found for: CHOL, TRIG, HDL, CHOLHDL, LDLCALC, LDLDIRECT  Wt Readings from Last 3 Encounters:  11/13/19 198 lb (89.8 kg)  11/11/19 198 lb (89.8 kg)  02/20/19 208 lb 9.6 oz (94.6 kg)     Objective:    Vital Signs:  BP (!) 147/83   Pulse 77   Ht 6' (1.829 m)   Wt 198 lb (89.8 kg)   BMI 26.85 kg/m    VITAL SIGNS:  reviewed  ASSESSMENT & PLAN:    1.  Coronary artery disease: History of CABG in 2010.  Nuclear stress test from June 2019 reviewed above with infarction and peri-infarct ischemia in the inferior and adjacent walls.  Continue aspirin and rosuvastatin.  He has been intolerant of Lopressor  and nebivolol as they both caused rashes.  He was unable to tolerate carvedilol.  2.  Hypercholesterolemia: Lipid panel on 01/08/2019 showed total cholesterol 94, triglycerides 55, HDL 34, LDL 49.  Continue Crestor 20 mg.  3.  Hypertension: Blood pressure is mildly elevated. I recently started diltiazem 30 mg bid.  4. Tachycardia:  I recently started diltiazem 30 mg bid.  Heart rates continue to fluctuate.  Labs reviewed above which were all unremarkable including normal TSH and unremarkable hemoglobin.  I will obtain a 3-week event monitor.    COVID-19 Education: The signs and symptoms of COVID-19 were discussed with the patient and how to seek care for testing (follow up with PCP or arrange E-visit).  The importance of social distancing was discussed today.  Time:   Today, I have spent 25 minutes with the patient with telehealth technology discussing the  above problems.     Medication Adjustments/Labs and Tests Ordered: Current medicines are reviewed at length with the patient today.  Concerns regarding medicines are outlined above.   Tests Ordered: No orders of the defined types were placed in this encounter.   Medication Changes: No orders of the defined types were placed in this encounter.   Follow Up: Virtual visit in 3 months  Signed, Kate Sable, MD  11/13/2019 11:37 AM    [place]

## 2019-11-17 ENCOUNTER — Other Ambulatory Visit: Payer: Self-pay

## 2019-11-17 ENCOUNTER — Emergency Department (HOSPITAL_COMMUNITY)
Admission: EM | Admit: 2019-11-17 | Discharge: 2019-11-17 | Disposition: A | Discharge status: Home / Self Care | Payer: Medicare Other | Attending: Emergency Medicine | Admitting: Emergency Medicine

## 2019-11-17 ENCOUNTER — Emergency Department (HOSPITAL_COMMUNITY): Payer: Medicare Other

## 2019-11-17 ENCOUNTER — Other Ambulatory Visit: Payer: Self-pay | Admitting: Critical Care Medicine

## 2019-11-17 DIAGNOSIS — Z7982 Long term (current) use of aspirin: Secondary | ICD-10-CM | POA: Insufficient documentation

## 2019-11-17 DIAGNOSIS — R030 Elevated blood-pressure reading, without diagnosis of hypertension: Secondary | ICD-10-CM | POA: Insufficient documentation

## 2019-11-17 DIAGNOSIS — I25810 Atherosclerosis of coronary artery bypass graft(s) without angina pectoris: Secondary | ICD-10-CM

## 2019-11-17 DIAGNOSIS — Z951 Presence of aortocoronary bypass graft: Secondary | ICD-10-CM | POA: Insufficient documentation

## 2019-11-17 DIAGNOSIS — U071 COVID-19: Secondary | ICD-10-CM | POA: Diagnosis not present

## 2019-11-17 DIAGNOSIS — R509 Fever, unspecified: Secondary | ICD-10-CM | POA: Diagnosis present

## 2019-11-17 DIAGNOSIS — I2510 Atherosclerotic heart disease of native coronary artery without angina pectoris: Secondary | ICD-10-CM | POA: Diagnosis not present

## 2019-11-17 DIAGNOSIS — Z79899 Other long term (current) drug therapy: Secondary | ICD-10-CM | POA: Diagnosis not present

## 2019-11-17 DIAGNOSIS — Z8249 Family history of ischemic heart disease and other diseases of the circulatory system: Secondary | ICD-10-CM | POA: Diagnosis not present

## 2019-11-17 DIAGNOSIS — Z96652 Presence of left artificial knee joint: Secondary | ICD-10-CM | POA: Diagnosis not present

## 2019-11-17 DIAGNOSIS — Z885 Allergy status to narcotic agent status: Secondary | ICD-10-CM | POA: Diagnosis not present

## 2019-11-17 DIAGNOSIS — E785 Hyperlipidemia, unspecified: Secondary | ICD-10-CM | POA: Diagnosis not present

## 2019-11-17 DIAGNOSIS — J1289 Other viral pneumonia: Secondary | ICD-10-CM | POA: Diagnosis not present

## 2019-11-17 LAB — CBC WITH DIFFERENTIAL/PLATELET
Abs Immature Granulocytes: 0.01 10*3/uL (ref 0.00–0.07)
Basophils Absolute: 0 10*3/uL (ref 0.0–0.1)
Basophils Relative: 0 %
Eosinophils Absolute: 0 10*3/uL (ref 0.0–0.5)
Eosinophils Relative: 0 %
HCT: 35.3 % — ABNORMAL LOW (ref 39.0–52.0)
Hemoglobin: 11.6 g/dL — ABNORMAL LOW (ref 13.0–17.0)
Immature Granulocytes: 0 %
Lymphocytes Relative: 43 %
Lymphs Abs: 1.9 10*3/uL (ref 0.7–4.0)
MCH: 28.9 pg (ref 26.0–34.0)
MCHC: 32.9 g/dL (ref 30.0–36.0)
MCV: 87.8 fL (ref 80.0–100.0)
Monocytes Absolute: 1.2 10*3/uL — ABNORMAL HIGH (ref 0.1–1.0)
Monocytes Relative: 27 %
Neutro Abs: 1.4 10*3/uL — ABNORMAL LOW (ref 1.7–7.7)
Neutrophils Relative %: 30 %
Platelets: 142 10*3/uL — ABNORMAL LOW (ref 150–400)
RBC: 4.02 MIL/uL — ABNORMAL LOW (ref 4.22–5.81)
RDW: 14.5 % (ref 11.5–15.5)
WBC: 4.5 10*3/uL (ref 4.0–10.5)
nRBC: 0 % (ref 0.0–0.2)

## 2019-11-17 LAB — COMPREHENSIVE METABOLIC PANEL
ALT: 35 U/L (ref 0–44)
AST: 51 U/L — ABNORMAL HIGH (ref 15–41)
Albumin: 3.5 g/dL (ref 3.5–5.0)
Alkaline Phosphatase: 78 U/L (ref 38–126)
Anion gap: 10 (ref 5–15)
BUN: 13 mg/dL (ref 8–23)
CO2: 24 mmol/L (ref 22–32)
Calcium: 8.6 mg/dL — ABNORMAL LOW (ref 8.9–10.3)
Chloride: 98 mmol/L (ref 98–111)
Creatinine, Ser: 0.97 mg/dL (ref 0.61–1.24)
GFR calc Af Amer: >60 mL/min (ref >60)
GFR calc non Af Amer: >60 mL/min (ref >60)
Glucose, Bld: 110 mg/dL — ABNORMAL HIGH (ref 70–99)
Potassium: 3.2 mmol/L — ABNORMAL LOW (ref 3.5–5.1)
Sodium: 132 mmol/L — ABNORMAL LOW (ref 135–145)
Total Bilirubin: 0.9 mg/dL (ref 0.3–1.2)
Total Protein: 7.3 g/dL (ref 6.5–8.1)

## 2019-11-17 LAB — FIBRINOGEN: Fibrinogen: 671 mg/dL — ABNORMAL HIGH (ref 210–475)

## 2019-11-17 LAB — URINALYSIS, ROUTINE W REFLEX MICROSCOPIC
Bacteria, UA: NONE SEEN
Bilirubin Urine: NEGATIVE
Glucose, UA: NEGATIVE mg/dL
Hgb urine dipstick: NEGATIVE
Ketones, ur: NEGATIVE mg/dL
Leukocytes,Ua: NEGATIVE
Nitrite: NEGATIVE
Protein, ur: 30 mg/dL — AB
Specific Gravity, Urine: 1.019 (ref 1.005–1.030)
pH: 5 (ref 5.0–8.0)

## 2019-11-17 LAB — TRIGLYCERIDES: Triglycerides: 34 mg/dL (ref <150)

## 2019-11-17 LAB — TROPONIN I (HIGH SENSITIVITY)
Troponin I (High Sensitivity): 12 ng/L (ref <18)
Troponin I (High Sensitivity): 13 ng/L (ref <18)

## 2019-11-17 LAB — SARS CORONAVIRUS 2 (TAT 6-24 HRS): SARS Coronavirus 2: POSITIVE — AB

## 2019-11-17 LAB — LACTATE DEHYDROGENASE: LDH: 165 U/L (ref 98–192)

## 2019-11-17 LAB — C-REACTIVE PROTEIN: CRP: 7.3 mg/dL — ABNORMAL HIGH (ref <1.0)

## 2019-11-17 LAB — D-DIMER, QUANTITATIVE: D-Dimer, Quant: 0.82 ug/mL-FEU — ABNORMAL HIGH (ref 0.00–0.50)

## 2019-11-17 LAB — PROCALCITONIN: Procalcitonin: <0.1 ng/mL

## 2019-11-17 LAB — BRAIN NATRIURETIC PEPTIDE: B Natriuretic Peptide: 121 pg/mL — ABNORMAL HIGH (ref 0.0–100.0)

## 2019-11-17 LAB — FERRITIN: Ferritin: 120 ng/mL (ref 24–336)

## 2019-11-17 LAB — LACTIC ACID, PLASMA: Lactic Acid, Venous: 1.1 mmol/L (ref 0.5–1.9)

## 2019-11-17 MED ORDER — ACETAMINOPHEN 500 MG PO TABS
1000.0000 mg | ORAL_TABLET | Freq: Once | ORAL | Status: AC
  Administered 2019-11-17: 1000 mg via ORAL
  Filled 2019-11-17: qty 2

## 2019-11-17 MED ORDER — SODIUM CHLORIDE 0.9% FLUSH
3.0000 mL | Freq: Once | INTRAVENOUS | Status: AC
  Administered 2019-11-17: 3 mL via INTRAVENOUS

## 2019-11-17 MED ORDER — AZITHROMYCIN 250 MG PO TABS
500.0000 mg | ORAL_TABLET | Freq: Once | ORAL | Status: AC
  Administered 2019-11-17: 15:00:00 500 mg via ORAL
  Filled 2019-11-17: qty 2

## 2019-11-17 MED ORDER — SODIUM CHLORIDE 0.9 % IV SOLN
1.0000 g | Freq: Once | INTRAVENOUS | Status: AC
  Administered 2019-11-17: 1 g via INTRAVENOUS
  Filled 2019-11-17: qty 10

## 2019-11-17 NOTE — Discharge Instructions (Signed)
1.  You have pneumonia due to Covid 19. 2.  You will follow up with the infectious disease clinic for further treatment.  Follow their instructions for additional treatment.  Dr. Jerrilyn Cairo contact information is included in your discharge instructions. 3.  Return to the emergency department if your symptoms are worsening or you feel your condition is getting worse. 4..  Take Tylenol per package instructions for control of fever.

## 2019-11-17 NOTE — ED Triage Notes (Signed)
Pt reports fever unknown origin for the last 4 days. Denies cough, abdominal, pain, SOB, urinary symptoms. Pt alert, oriented x4, VSS.

## 2019-11-17 NOTE — Progress Notes (Signed)
  I connected by phone with [name] [name] on 11/17/2019 at 7:11 PM to discuss the potential use of an new treatment for mild to moderate COVID-19 viral infection in non-hospitalized patients.  This patient is a 78 y.o. male that meets the FDA criteria for Emergency Use Authorization of bamlanivimab or casirivimab\imdevimab.  Has a (+) direct SARS-CoV-2 viral test result  Has mild or moderate COVID-19   Is ? 78 years of age and weighs ? 40 kg  Is NOT hospitalized due to COVID-19  Is NOT requiring oxygen therapy or requiring an increase in baseline oxygen flow rate due to COVID-19  Is within 10 days of symptom onset  Has at least one of the high risk factor(s) for progression to severe COVID-19 and/or hospitalization as defined in EUA.  Specific high risk criteria : >/= 76 yo, age > 67 with CAD   I have spoken and communicated the following to the patient or parent/caregiver:  1. FDA has authorized the emergency use of bamlanivimab and casirivimab\imdevimab for the treatment of mild to moderate COVID-19 in adults and pediatric patients with positive results of direct SARS-CoV-2 viral testing who are 23 years of age and older weighing at least 40 kg, and who are at high risk for progressing to severe COVID-19 and/or hospitalization.  2. The significant known and potential risks and benefits of bamlanivimab and casirivimab\imdevimab, and the extent to which such potential risks and benefits are unknown.  3. Information on available alternative treatments and the risks and benefits of those alternatives, including clinical trials.  4. Patients treated with bamlanivimab and casirivimab\imdevimab should continue to self-isolate and use infection control measures (e.g., wear mask, isolate, social distance, avoid sharing personal items, clean and disinfect "high touch" surfaces, and frequent handwashing) according to CDC guidelines.   5. The patient or parent/caregiver has the option to accept  or refuse bamlanivimab or casirivimab\imdevimab .  After reviewing this information with the patient, The patient agreed to proceed with receiving the bamlanimivab infusion and will be provided a copy of the Fact sheet prior to receiving the infusion.[name] 11/17/2019 7:11 PM

## 2019-11-17 NOTE — Progress Notes (Signed)
  I connected by phone with [name] [name] on 11/17/2019 at 5:06 PM to discuss the potential use of an new treatment for mild to moderate COVID-19 viral infection in non-hospitalized patients.  This patient is a 78 y.o. male that meets the FDA criteria for Emergency Use Authorization of bamlanivimab or casirivimab\imdevimab.  Has a (+) direct SARS-CoV-2 viral test result  Has mild or moderate COVID-19   Is ? 78 years of age and weighs ? 40 kg  Is NOT hospitalized due to COVID-19  Is NOT requiring oxygen therapy or requiring an increase in baseline oxygen flow rate due to COVID-19  Is within 10 days of symptom onset  Has at least one of the high risk factor(s) for progression to severe COVID-19 and/or hospitalization as defined in EUA:  1) age and heart disease   I have spoken and communicated the following to the patient or parent/caregiver:  1. FDA has authorized the emergency use of bamlanivimab and casirivimab\imdevimab for the treatment of mild to moderate COVID-19 in adults and pediatric patients with positive results of direct SARS-CoV-2 viral testing who are 32 years of age and older weighing at least 40 kg, and who are at high risk for progressing to severe COVID-19 and/or hospitalization.  2. The significant known and potential risks and benefits of bamlanivimab and casirivimab\imdevimab, and the extent to which such potential risks and benefits are unknown.  3. Information on available alternative treatments and the risks and benefits of those alternatives, including clinical trials.  4. Patients treated with bamlanivimab and casirivimab\imdevimab should continue to self-isolate and use infection control measures (e.g., wear mask, isolate, social distance, avoid sharing personal items, clean and disinfect "high touch" surfaces, and frequent handwashing) according to CDC guidelines.   5. The patient or parent/caregiver has the option to accept or refuse bamlanivimab or  casirivimab\imdevimab .  After reviewing this information with the patient, he has agreed to get infusion.  I will have someone from the infusion clinic contact [name] tomorrow to arrange for monoclonal infusion on Thursday.  Carlyle Basques 11/17/2019 5:06 PM

## 2019-11-17 NOTE — ED Notes (Signed)
ED Provider at bedside. 

## 2019-11-17 NOTE — ED Provider Notes (Signed)
[facility] EMERGENCY DEPARTMENT Provider Note   CSN: [id number] Arrival date & time: 11/17/19  [id number]     History Chief Complaint  Patient presents with  . Fever    [name] [name] is a 78 y.o. male.  HPI Patient reports he developed a fever about 4 days ago.  He denies that he is had cough or shortness of breath.  He denies upper respiratory symptoms of nasal congestion or sore throat.  He denies pain burning or urgency with urination.  He does report he has felt fatigued.  He reports his joints always ache and he does not perceive that that is any worse or different than what he would consider baseline. Although the patient is aware of his fever, his chief concern is addressing hypertension and elevated heart rate.  He reports he has been monitoring these and recording his numbers.  He advises he did call his cardiologist but did not get a return call until several days later.  He does not perceive palpitation or heart racing.    Past Medical History:  Diagnosis Date  . CAD (coronary artery disease) of artery bypass graft    1.Status post coronary artery bypass grafting. 2. Normal left ventricular function. 3. Increase left atrial dilatation.  . OA (osteoarthritis) of knee     Patient Active Problem List   Diagnosis Date Noted  . History of colonic polyps 03/29/2017  . DYSLIPIDEMIA 10/15/2009  . SINUS BRADYCARDIA 10/15/2009  . DEGENERATIVE JOINT DISEASE, KNEE 10/15/2009  . CORONARY ARTERY BYPASS GRAFT, HX OF 10/15/2009  . CAD, ARTERY BYPASS GRAFT 09/11/2009    Past Surgical History:  Procedure Laterality Date  . COLONOSCOPY N/A 08/03/2017   Procedure: COLONOSCOPY;  Surgeon: Rogene Houston, MD;  Location: [facility];  Service: Endoscopy;  Laterality: N/A;  730  . CORONARY ARTERY BYPASS GRAFT    . FLEXIBLE SIGMOIDOSCOPY N/A 08/15/2017   Procedure: FLEXIBLE SIGMOIDOSCOPY;  Surgeon: Rogene Houston, MD;  Location: [facility];  Service: Endoscopy;   Laterality: N/A;  . JOINT REPLACEMENT Left 2012   Left knee  . KNEE ARTHROSCOPY         Family History  Problem Relation Age of Onset  . Coronary artery disease Other   . Coronary artery disease Mother   . Coronary artery disease Father   . Heart attack Father     Social History   Tobacco Use  . Smoking status: Never Smoker  . Smokeless tobacco: Never Used  Substance Use Topics  . Alcohol use: No    Alcohol/week: 0.0 standard drinks  . Drug use: No    Home Medications Prior to Admission medications   Medication Sig Start Date End Date Taking? Authorizing Provider  aspirin 81 MG tablet Take 1 tablet (81 mg total) by mouth every other day. 08/22/17   Rogene Houston, MD  Cholecalciferol (VITAMIN D) 2000 units CAPS Take 1 capsule by mouth daily.    [provider]  diltiazem (CARDIZEM) 30 MG tablet Take 1 tablet (30 mg total) by mouth 2 (two) times daily. 11/11/19   Herminio Commons, MD  ferrous gluconate (FERGON) 324 MG tablet Take 324 mg by mouth 3 (three) times daily with meals.    [provider]  fluticasone (FLONASE) 50 MCG/ACT nasal spray Place 1 spray into the nose at bedtime.  03/22/12   [provider]  loratadine (CLARITIN) 10 MG tablet Take 10 mg by mouth daily.    [provider]  Multiple Vitamin (MULTIVITAMIN) tablet Take 1 tablet by mouth daily.      [provider]  nitroGLYCERIN (NITROSTAT) 0.4 MG SL tablet Place 1 tablet (0.4 mg total) under the tongue every 5 (five) minutes as needed. 11/12/15   Lendon Colonel, NP  Omega-3 Fatty Acids (FISH OIL) 600 MG CAPS Take 600 mg by mouth daily.    [provider]  oxyCODONE-acetaminophen (PERCOCET/ROXICET) 5-325 MG tablet Take 1 tablet by mouth every 4 (four) hours as needed for severe pain.    [provider]  pantoprazole (PROTONIX) 40 MG tablet Take 40 mg by mouth daily.      [provider]  rosuvastatin (CRESTOR) 20 MG tablet Take 20 mg  by mouth daily with lunch.     [provider]    Allergies    Morphine  Review of Systems   Review of Systems 10 Systems reviewed and are negative for acute change except as noted in the HPI.  Physical Exam Updated Vital Signs BP 139/74   Pulse 75   Temp 99.3 F (37.4 C) (Oral)   Resp 19   SpO2 99%   Physical Exam Constitutional:      Comments: Patient is alert and nontoxic.  No respiratory distress.  Mental status clear.  HENT:     Head: Normocephalic and atraumatic.     Mouth/Throat:     Mouth: Mucous membranes are moist.     Pharynx: Oropharynx is clear.  Eyes:     Extraocular Movements: Extraocular movements intact.     Pupils: Pupils are equal, round, and reactive to light.  Cardiovascular:     Rate and Rhythm: Normal rate and regular rhythm.  Pulmonary:     Effort: Pulmonary effort is normal.     Breath sounds: Normal breath sounds.  Abdominal:     General: There is no distension.     Palpations: Abdomen is soft.     Tenderness: There is no abdominal tenderness. There is no guarding.  Musculoskeletal:        General: No swelling or tenderness. Normal range of motion.     Right lower leg: No edema.     Left lower leg: No edema.  Skin:    General: Skin is warm and dry.  Neurological:     General: No focal deficit present.     Mental Status: He is oriented to person, place, and time.     Coordination: Coordination normal.  Psychiatric:        Mood and Affect: Mood normal.     ED Results / Procedures / Treatments   Labs (all labs ordered are listed, but only abnormal results are displayed) Labs Reviewed  SARS CORONAVIRUS 2 (TAT 6-24 HRS) - Abnormal; Notable for the following components:      Result Value   SARS Coronavirus 2 POSITIVE (*)    All other components within normal limits  COMPREHENSIVE METABOLIC PANEL - Abnormal; Notable for the following components:   Sodium 132 (*)    Potassium 3.2 (*)    Glucose, Bld 110 (*)    Calcium 8.6  (*)    AST 51 (*)    All other components within normal limits  CBC WITH DIFFERENTIAL/PLATELET - Abnormal; Notable for the following components:   RBC 4.02 (*)    Hemoglobin 11.6 (*)    HCT 35.3 (*)    Platelets 142 (*)    Neutro Abs 1.4 (*)    Monocytes Absolute 1.2 (*)  All other components within normal limits  URINALYSIS, ROUTINE W REFLEX MICROSCOPIC - Abnormal; Notable for the following components:   Protein, ur 30 (*)    All other components within normal limits  D-DIMER, QUANTITATIVE (NOT AT [facility]) - Abnormal; Notable for the following components:   D-Dimer, Quant 0.82 (*)    All other components within normal limits  FIBRINOGEN - Abnormal; Notable for the following components:   Fibrinogen 671 (*)    All other components within normal limits  C-REACTIVE PROTEIN - Abnormal; Notable for the following components:   CRP 7.3 (*)    All other components within normal limits  BRAIN NATRIURETIC PEPTIDE - Abnormal; Notable for the following components:   B Natriuretic Peptide 121.0 (*)    All other components within normal limits  CULTURE, BLOOD (ROUTINE X 2)  CULTURE, BLOOD (ROUTINE X 2)  LACTIC ACID, PLASMA  PROCALCITONIN  LACTATE DEHYDROGENASE  FERRITIN  TRIGLYCERIDES  LACTIC ACID, PLASMA  TROPONIN I (HIGH SENSITIVITY)  TROPONIN I (HIGH SENSITIVITY)    EKG EKG Interpretation  Date/Time:  Monday November 17 2019 11:08:07 EST Ventricular Rate:  91 PR Interval:    QRS Duration: 103 QT Interval:  360 QTC Calculation: 443 R Axis:   -38 Text Interpretation: Sinus rhythm Borderline prolonged PR interval Incomplete RBBB and LAFB RSR' in V1 or V2, right VCD or RVH agree, unchanged from previous Confirmed by Charlesetta Shanks [phone]) on 11/17/2019 11:54:47 AM   Radiology DG Chest Portable 1 View  Result Date: 11/17/2019 CLINICAL DATA:  Fever of unknown origin. EXAM: PORTABLE CHEST 1 VIEW COMPARISON:  Chest x-ray 03/12/2009 FINDINGS: The cardiac silhouette, mediastinal  and hilar contours are within normal limits given the AP projection and portable technique. Stable surgical changes from bypass surgery are noted. There is mild tortuosity and calcification of the thoracic aorta. Moderate eventration of the right hemidiaphragm with overlying vascular crowding and streaky atelectasis. Patchy density in the right upper lobe suspicious for pneumonia. IMPRESSION: Patchy right upper lobe airspace opacity suspicious for pneumonia. Followup PA and lateral chest X-ray is recommended in 3-4 weeks following trial of antibiotic therapy to ensure resolution and exclude underlying malignancy. Electronically Signed   By: Marijo Sanes M.D.   On: 11/17/2019 10:50    Procedures Procedures (including critical care time)  Medications Ordered in ED Medications  sodium chloride flush (NS) 0.9 % injection 3 mL (3 mLs Intravenous Given 11/17/19 1009)  acetaminophen (TYLENOL) tablet 1,000 mg (1,000 mg Oral Given 11/17/19 1009)  cefTRIAXone (ROCEPHIN) 1 g in sodium chloride 0.9 % 100 mL IVPB (1 g Intravenous New Bag/Given 11/17/19 1451)  azithromycin (ZITHROMAX) tablet 500 mg (500 mg Oral Given 11/17/19 1451)  Consult: Dr. Kathrynn Humble has reviewed the case with Dr. Graylon Good who will make subsequent arrangements for management.  ED Course  I have reviewed the triage vital signs and the nursing notes.  Pertinent labs & imaging results that were available during my care of the patient were reviewed by me and considered in my medical decision making (see chart for details).    MDM Rules/Calculators/A&P     CHA2DS2/VAS Stroke Risk Points      N/A >= 2 Points: High Risk  1 - 1.99 Points: Medium Risk  0 Points: Low Risk    A final score could not be computed because of missing components.: Last  Change: N/A     This score determines the patient's risk of having a stroke if the  patient has atrial fibrillation.  This score is not applicable to this patient. Components are not    calculated.                  Patient has clinically well appearance.  Vital signs are stable.  Patient does not have hypoxia.  He was aware of having fever for 4 days but denied associated symptoms.  Chest x-ray showed a focus of pneumonia in the right upper lobe.  Without other associated symptoms I had initial suspicion for community-acquired pneumonia.  Patient was given a dose of Rocephin and Zithromax.  Ultimately however, Covid testing did return positive.  As the patient is clinically well in appearance without any respiratory distress, hypoxia or other vital sign instability, plans are being made for follow-up with infectious disease for outpatient treatment.  Return precautions reviewed.  Final Clinical Impression(s) / ED Diagnoses Final diagnoses:  Pneumonia due to COVID-19 virus    Rx / DC Orders ED Discharge Orders    None       Charlesetta Shanks, MD 11/17/19 1708

## 2019-11-17 NOTE — ED Notes (Signed)
Diet ordered 

## 2019-11-20 ENCOUNTER — Ambulatory Visit (HOSPITAL_COMMUNITY)
Admission: RE | Admit: 2019-11-20 | Discharge: 2019-11-20 | Disposition: A | Discharge status: Home / Self Care | Payer: Medicare Other | Source: Ambulatory Visit | Attending: Pulmonary Disease | Admitting: Pulmonary Disease

## 2019-11-20 DIAGNOSIS — Z23 Encounter for immunization: Secondary | ICD-10-CM | POA: Diagnosis not present

## 2019-11-20 DIAGNOSIS — I25810 Atherosclerosis of coronary artery bypass graft(s) without angina pectoris: Secondary | ICD-10-CM | POA: Insufficient documentation

## 2019-11-20 DIAGNOSIS — U071 COVID-19: Secondary | ICD-10-CM | POA: Insufficient documentation

## 2019-11-20 MED ORDER — SODIUM CHLORIDE 0.9 % IV SOLN
INTRAVENOUS | Status: DC | PRN
  Administered 2019-11-20: 12:00:00 250 mL via INTRAVENOUS

## 2019-11-20 MED ORDER — METHYLPREDNISOLONE SODIUM SUCC 125 MG IJ SOLR: 125.0000 mg | Freq: Once | INTRAMUSCULAR | Status: DC | PRN

## 2019-11-20 MED ORDER — FAMOTIDINE IN NACL 20-0.9 MG/50ML-% IV SOLN: 20.0000 mg | Freq: Once | INTRAVENOUS | Status: DC | PRN

## 2019-11-20 MED ORDER — EPINEPHRINE 0.3 MG/0.3ML IJ SOAJ: 0.3000 mg | Freq: Once | INTRAMUSCULAR | Status: DC | PRN

## 2019-11-20 MED ORDER — ALBUTEROL SULFATE HFA 108 (90 BASE) MCG/ACT IN AERS: 2.0000 | INHALATION_SPRAY | Freq: Once | RESPIRATORY_TRACT | Status: DC | PRN

## 2019-11-20 MED ORDER — DIPHENHYDRAMINE HCL 50 MG/ML IJ SOLN: 50.0000 mg | Freq: Once | INTRAMUSCULAR | Status: DC | PRN

## 2019-11-20 MED ORDER — SODIUM CHLORIDE 0.9 % IV SOLN
700.0000 mg | Freq: Once | INTRAVENOUS | Status: DC
  Filled 2019-11-20: qty 20

## 2019-11-20 MED ORDER — SODIUM CHLORIDE 0.9 % IV SOLN
700.0000 mg | Freq: Once | INTRAVENOUS | Status: AC
  Administered 2019-11-20: 700 mg via INTRAVENOUS
  Filled 2019-11-20: qty 20

## 2019-11-20 NOTE — Discharge Instructions (Signed)
COVID-19 °COVID-19 is a respiratory infection that is caused by a virus called severe acute respiratory syndrome coronavirus 2 (SARS-CoV-2). The disease is also known as coronavirus disease or novel coronavirus. In some people, the virus may not cause any symptoms. In others, it may cause a serious infection. The infection can get worse quickly and can lead to complications, such as: °· Pneumonia, or infection of the lungs. °· Acute respiratory distress syndrome or ARDS. This is fluid build-up in the lungs. °· Acute respiratory failure. This is a condition in which there is not enough oxygen passing from the lungs to the body. °· Sepsis or septic shock. This is a serious bodily reaction to an infection. °· Blood clotting problems. °· Secondary infections due to bacteria or fungus. °The virus that causes COVID-19 is contagious. This means that it can spread from person to person through droplets from coughs and sneezes (respiratory secretions). °What are the causes? °This illness is caused by a virus. You may catch the virus by: °· Breathing in droplets from an infected person's cough or sneeze. °· Touching something, like a table or a doorknob, that was exposed to the virus (contaminated) and then touching your mouth, nose, or eyes. °What increases the risk? °Risk for infection °You are more likely to be infected with this virus if you: °· Live in or travel to an area with a COVID-19 outbreak. °· Come in contact with a sick person who recently traveled to an area with a COVID-19 outbreak. °· Provide care for or live with a person who is infected with COVID-19. °Risk for serious illness °You are more likely to become seriously ill from the virus if you: °· Are 65 years of age or older. °· Have a long-term disease that lowers your body's ability to fight infection (immunocompromised). °· Live in a nursing home or long-term care facility. °· Have a long-term (chronic) disease such as: °? Chronic lung disease, including  chronic obstructive pulmonary disease or asthma °? Heart disease. °? Diabetes. °? Chronic kidney disease. °? Liver disease. °· Are obese. °What are the signs or symptoms? °Symptoms of this condition can range from mild to severe. Symptoms may appear any time from 2 to 14 days after being exposed to the virus. They include: °· A fever. °· A cough. °· Difficulty breathing. °· Chills. °· Muscle pains. °· A sore throat. °· Loss of taste or smell. °Some people may also have stomach problems, such as nausea, vomiting, or diarrhea. °Other people may not have any symptoms of COVID-19. °How is this diagnosed? °This condition may be diagnosed based on: °· Your signs and symptoms, especially if: °? You live in an area with a COVID-19 outbreak. °? You recently traveled to or from an area where the virus is common. °? You provide care for or live with a person who was diagnosed with COVID-19. °· A physical exam. °· Lab tests, which may include: °? A nasal swab to take a sample of fluid from your nose. °? A throat swab to take a sample of fluid from your throat. °? A sample of mucus from your lungs (sputum). °? Blood tests. °· Imaging tests, which may include, X-rays, CT scan, or ultrasound. °How is this treated? °At present, there is no medicine to treat COVID-19. Medicines that treat other diseases are being used on a trial basis to see if they are effective against COVID-19. °Your health care provider will talk with you about ways to treat your symptoms. For most   people, the infection is mild and can be managed at home with rest, fluids, and over-the-counter medicines. °Treatment for a serious infection usually takes places in a hospital intensive care unit (ICU). It may include one or more of the following treatments. These treatments are given until your symptoms improve. °· Receiving fluids and medicines through an IV. °· Supplemental oxygen. Extra oxygen is given through a tube in the nose, a face mask, or a  hood. °· Positioning you to lie on your stomach (prone position). This makes it easier for oxygen to get into the lungs. °· Continuous positive airway pressure (CPAP) or bi-level positive airway pressure (BPAP) machine. This treatment uses mild air pressure to keep the airways open. A tube that is connected to a motor delivers oxygen to the body. °· Ventilator. This treatment moves air into and out of the lungs by using a tube that is placed in your windpipe. °· Tracheostomy. This is a procedure to create a hole in the neck so that a breathing tube can be inserted. °· Extracorporeal membrane oxygenation (ECMO). This procedure gives the lungs a chance to recover by taking over the functions of the heart and lungs. It supplies oxygen to the body and removes carbon dioxide. °Follow these instructions at home: °Lifestyle °· If you are sick, stay home except to get medical care. Your health care provider will tell you how long to stay home. Call your health care provider before you go for medical care. °· Rest at home as told by your health care provider. °· Do not use any products that contain nicotine or tobacco, such as cigarettes, e-cigarettes, and chewing tobacco. If you need help quitting, ask your health care provider. °· Return to your normal activities as told by your health care provider. Ask your health care provider what activities are safe for you. °General instructions °· Take over-the-counter and prescription medicines only as told by your health care provider. °· Drink enough fluid to keep your urine pale yellow. °· Keep all follow-up visits as told by your health care provider. This is important. °How is this prevented? ° °There is no vaccine to help prevent COVID-19 infection. However, there are steps you can take to protect yourself and others from this virus. °To protect yourself:  °· Do not travel to areas where COVID-19 is a risk. The areas where COVID-19 is reported change often. To identify  high-risk areas and travel restrictions, check the CDC travel website: wwwnc.cdc.gov/travel/notices °· If you live in, or must travel to, an area where COVID-19 is a risk, take precautions to avoid infection. °? Stay away from people who are sick. °? Wash your hands often with soap and water for 20 seconds. If soap and water are not available, use an alcohol-based hand sanitizer. °? Avoid touching your mouth, face, eyes, or nose. °? Avoid going out in public, follow guidance from your state and local health authorities. °? If you must go out in public, wear a cloth face covering or face mask. °? Disinfect objects and surfaces that are frequently touched every day. This may include: °§ Counters and tables. °§ Doorknobs and light switches. °§ Sinks and faucets. °§ Electronics, such as phones, remote controls, keyboards, computers, and tablets. °To protect others: °If you have symptoms of COVID-19, take steps to prevent the virus from spreading to others. °· If you think you have a COVID-19 infection, contact your health care provider right away. Tell your health care team that you think you may   have a COVID-19 infection. °· Stay home. Leave your house only to seek medical care. Do not use public transport. °· Do not travel while you are sick. °· Wash your hands often with soap and water for 20 seconds. If soap and water are not available, use alcohol-based hand sanitizer. °· Stay away from other members of your household. Let healthy household members care for children and pets, if possible. If you have to care for children or pets, wash your hands often and wear a mask. If possible, stay in your own room, separate from others. Use a different bathroom. °· Make sure that all people in your household wash their hands well and often. °· Cough or sneeze into a tissue or your sleeve or elbow. Do not cough or sneeze into your hand or into the air. °· Wear a cloth face covering or face mask. °Where to find more  information °· Centers for Disease Control and Prevention: www.cdc.gov/coronavirus/2019-ncov/index.html °· World Health Organization: www.who.int/health-topics/coronavirus °Contact a health care provider if: °· You live in or have traveled to an area where COVID-19 is a risk and you have symptoms of the infection. °· You have had contact with someone who has COVID-19 and you have symptoms of the infection. °Get help right away if: °· You have trouble breathing. °· You have pain or pressure in your chest. °· You have confusion. °· You have bluish lips and fingernails. °· You have difficulty waking from sleep. °· You have symptoms that get worse. °These symptoms may represent a serious problem that is an emergency. Do not wait to see if the symptoms will go away. Get medical help right away. Call your local emergency services (911 in the [place] Do not drive yourself to the hospital. Let the emergency medical personnel know if you think you have COVID-19. °Summary °· COVID-19 is a respiratory infection that is caused by a virus. It is also known as coronavirus disease or novel coronavirus. It can cause serious infections, such as pneumonia, acute respiratory distress syndrome, acute respiratory failure, or sepsis. °· The virus that causes COVID-19 is contagious. This means that it can spread from person to person through droplets from coughs and sneezes. °· You are more likely to develop a serious illness if you are 65 years of age or older, have a weak immunity, live in a nursing home, or have chronic disease. °· There is no medicine to treat COVID-19. Your health care provider will talk with you about ways to treat your symptoms. °· Take steps to protect yourself and others from infection. Wash your hands often and disinfect objects and surfaces that are frequently touched every day. Stay away from people who are sick and wear a mask if you are sick. °This information is not intended to replace advice given to you by  your health care provider. Make sure you discuss any questions you have with your health care provider. °Document Released: 12/26/2018 Document Revised: 04/17/2019 Document Reviewed: 12/26/2018 °Elsevier Patient Education © 2020 [employer] ° °COVID-19: How to Protect Yourself and Others °Know how it spreads °· There is currently no vaccine to prevent coronavirus disease 2019 (COVID-19). °· The best way to prevent illness is to avoid being exposed to this virus. °· The virus is thought to spread mainly from person-to-person. °? Between people who are in close contact with one another (within about 6 feet). °? Through respiratory droplets produced when an infected person coughs, sneezes or talks. °? These droplets can land in   the mouths or noses of people who are nearby or possibly be inhaled into the lungs. °? Some recent studies have suggested that COVID-19 may be spread by people who are not showing symptoms. °Everyone should °Clean your hands often °· Wash your hands often with soap and water for at least 20 seconds especially after you have been in a public place, or after blowing your nose, coughing, or sneezing. °· If soap and water are not readily available, use a hand sanitizer that contains at least 60% alcohol. Cover all surfaces of your hands and rub them together until they feel dry. °· Avoid touching your eyes, nose, and mouth with unwashed hands. °Avoid close contact °· Stay home if you are sick. °· Avoid close contact with people who are sick. °· Put distance between yourself and other people. °? Remember that some people without symptoms may be able to spread virus. °? This is especially important for people who are at higher risk of getting very sick.www.cdc.gov/coronavirus/2019-ncov/need-extra-precautions/people-at-higher-risk.html °Cover your mouth and nose with a cloth face cover when around others °· You could spread COVID-19 to others even if you do not feel sick. °· Everyone should wear a  cloth face cover when they have to go out in public, for example to the grocery store or to pick up other necessities. °? Cloth face coverings should not be placed on young children under age 2, anyone who has trouble breathing, or is unconscious, incapacitated or otherwise unable to remove the mask without assistance. °· The cloth face cover is meant to protect other people in case you are infected. °· Do NOT use a facemask meant for a healthcare worker. °· Continue to keep about 6 feet between yourself and others. The cloth face cover is not a substitute for social distancing. °Cover coughs and sneezes °· If you are in a private setting and do not have on your cloth face covering, remember to always cover your mouth and nose with a tissue when you cough or sneeze or use the inside of your elbow. °· Throw used tissues in the trash. °· Immediately wash your hands with soap and water for at least 20 seconds. If soap and water are not readily available, clean your hands with a hand sanitizer that contains at least 60% alcohol. °Clean and disinfect °· Clean AND disinfect frequently touched surfaces daily. This includes tables, doorknobs, light switches, countertops, handles, desks, phones, keyboards, toilets, faucets, and sinks. www.cdc.gov/coronavirus/2019-ncov/prevent-getting-sick/disinfecting-your-home.html °· If surfaces are dirty, clean them: Use detergent or soap and water prior to disinfection. °· Then, use a household disinfectant. You can see a list of EPA-registered household disinfectants here. °cdc.gov/coronavirus °04/08/2019 °This information is not intended to replace advice given to you by your health care provider. Make sure you discuss any questions you have with your health care provider. °Document Released: 03/18/2019 Document Revised: 04/16/2019 Document Reviewed: 03/18/2019 °Elsevier Patient Education © 2020 [employer] ° °

## 2019-11-20 NOTE — Progress Notes (Signed)
.   Diagnosis: COVID-19  Physician: Dr. Joya Gaskins  Procedure: [facility] Med: remdesivir infusion.  Complications: No immediate complications noted.  Discharge: Discharged home   Babs Sciara 11/20/2019

## 2019-11-20 NOTE — Progress Notes (Addendum)
Infusion Clinic   Patient reports that he had episodes of fever and elevated heart rate the last couple of day. Patient is asymptomatic. He denies palpitation or heart racing. Patient states that he forgot to take cardizem this morning. RN reminded patient to take his cardizem and tylenol for fever.  RN instructed patient to continue to monitor heart rate/temp and to contact PCP and cardiologist if needed.  .BP (!) 163/78 (BP Location: Left Arm)   Pulse (!) 120   Temp (!) 100.9 F (38.3 C) (Oral) Comment: pt will take tylenol.   Resp 20   SpO2 99%

## 2019-11-22 LAB — CULTURE, BLOOD (ROUTINE X 2)
Culture: NO GROWTH
Culture: NO GROWTH

## 2019-11-25 ENCOUNTER — Encounter (INDEPENDENT_AMBULATORY_CARE_PROVIDER_SITE_OTHER): Payer: Medicare Other

## 2019-11-25 DIAGNOSIS — R000 Tachycardia, unspecified: Secondary | ICD-10-CM | POA: Diagnosis not present

## 2019-12-04 DIAGNOSIS — I10 Essential (primary) hypertension: Secondary | ICD-10-CM | POA: Diagnosis not present

## 2019-12-04 DIAGNOSIS — E782 Mixed hyperlipidemia: Secondary | ICD-10-CM | POA: Diagnosis not present

## 2019-12-29 ENCOUNTER — Other Ambulatory Visit: Payer: Self-pay

## 2020-01-02 DIAGNOSIS — E7849 Other hyperlipidemia: Secondary | ICD-10-CM | POA: Diagnosis not present

## 2020-01-02 DIAGNOSIS — I10 Essential (primary) hypertension: Secondary | ICD-10-CM | POA: Diagnosis not present

## 2020-01-30 DIAGNOSIS — E7849 Other hyperlipidemia: Secondary | ICD-10-CM | POA: Diagnosis not present

## 2020-01-30 DIAGNOSIS — I10 Essential (primary) hypertension: Secondary | ICD-10-CM | POA: Diagnosis not present

## 2020-02-11 ENCOUNTER — Other Ambulatory Visit: Payer: Self-pay

## 2020-02-11 ENCOUNTER — Encounter: Payer: Self-pay | Admitting: Student

## 2020-02-11 ENCOUNTER — Ambulatory Visit (INDEPENDENT_AMBULATORY_CARE_PROVIDER_SITE_OTHER): Payer: Medicare Other | Admitting: Student

## 2020-02-11 VITALS — BP 138/76 | HR 85 | Temp 97.8°F | Ht 72.0 in | Wt 203.0 lb

## 2020-02-11 DIAGNOSIS — I10 Essential (primary) hypertension: Secondary | ICD-10-CM

## 2020-02-11 DIAGNOSIS — D649 Anemia, unspecified: Secondary | ICD-10-CM

## 2020-02-11 DIAGNOSIS — E785 Hyperlipidemia, unspecified: Secondary | ICD-10-CM | POA: Diagnosis not present

## 2020-02-11 DIAGNOSIS — I2510 Atherosclerotic heart disease of native coronary artery without angina pectoris: Secondary | ICD-10-CM | POA: Diagnosis not present

## 2020-02-11 DIAGNOSIS — R002 Palpitations: Secondary | ICD-10-CM

## 2020-02-11 MED ORDER — NITROGLYCERIN 0.4 MG SL SUBL: 0.4000 mg | SUBLINGUAL_TABLET | SUBLINGUAL | 3 refills | Status: DC | PRN

## 2020-02-11 NOTE — Progress Notes (Signed)
Cardiology Office Note    Date:  02/11/2020   ID:  Ambrosio, [place] 04/29/1941, MRN [id number]  PCP:  Caryl Bis, MD  Cardiologist: Kate Sable, MD    Chief Complaint  Patient presents with  . Follow-up    3 month visit    History of Present Illness:    [name] [name] is a 79 y.o. male with past medical history of CAD (s/p CABG in 02/2009), palpitations, HTN and HLD who presents to the office today for 46-month follow-up.  He most recently had a telehealth visit with Dr. Bronson Ing in 11/2019 reported recent palpitations with intermittent episodes of tachycardia. His heart rate had been elevated in the 120's to 130's at times when checked at home and he was previously intolerant to Bystolic, Coreg and Lopressor due to rashes. He was continued on short-acting Cardizem 30mg  BID and a cardiac event monitor was ordered for further assessment. This showed sinus rhythm with episodes of sinus tachycardia and one isolated run of 4 beats NSVT but no significant arrhythmias.  In talking with the patient today, he reports overall doing well since his last visit. His palpitations have resolved and he denies any recurrent symptoms. He has baseline dyspnea on exertion but denies any associated chest pain or palpitations. No recent change in his breathing. He denies any orthopnea, PND or lower extremity edema.  He does report being under increased stress as his 3 grandchildren stay with the patient and his wife throughout the week. They have been helping with virtual school given the COVID-19 pandemic. 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    Past Medical History:  Diagnosis Date  . CAD (coronary artery disease) of artery bypass graft    1.Status post coronary artery bypass grafting. 2. Normal left ventricular function. 3. Increase left atrial dilatation.  . Hyperlipidemia LDL goal <70   . OA (osteoarthritis) of knee     Past Surgical History:   Procedure Laterality Date  . COLONOSCOPY N/A 08/03/2017   Procedure: COLONOSCOPY;  Surgeon: Rogene Houston, MD;  Location: [facility];  Service: Endoscopy;  Laterality: N/A;  730  . CORONARY ARTERY BYPASS GRAFT    . FLEXIBLE SIGMOIDOSCOPY N/A 08/15/2017   Procedure: FLEXIBLE SIGMOIDOSCOPY;  Surgeon: Rogene Houston, MD;  Location: [facility];  Service: Endoscopy;  Laterality: N/A;  . JOINT REPLACEMENT Left 2012   Left knee  . KNEE ARTHROSCOPY      Current Medications: Outpatient Medications Prior to Visit  Medication Sig Dispense Refill  . aspirin 81 MG tablet Take 1 tablet (81 mg total) by mouth every other day. 30 tablet   . Cholecalciferol (VITAMIN D) 2000 units CAPS Take 1 capsule by mouth daily.    Marland Kitchen diltiazem (CARDIZEM) 30 MG tablet Take 1 tablet (30 mg total) by mouth 2 (two) times daily. 60 tablet 11  . ferrous gluconate (FERGON) 324 MG tablet Take 324 mg by mouth 3 (three) times daily with meals.    . fluticasone (FLONASE) 50 MCG/ACT nasal spray Place 1 spray into the nose at bedtime.     Marland Kitchen loratadine (CLARITIN) 10 MG tablet Take 10 mg by mouth daily.    . Multiple Vitamin (MULTIVITAMIN) tablet Take 1 tablet by mouth daily.      . Omega-3 Fatty Acids (FISH OIL) 600 MG CAPS Take 600 mg by mouth daily.    Marland Kitchen oxyCODONE-acetaminophen (PERCOCET/ROXICET) 5-325 MG tablet Take 1 tablet  by mouth every 4 (four) hours as needed for severe pain.    . pantoprazole (PROTONIX) 40 MG tablet Take 40 mg by mouth daily.      . rosuvastatin (CRESTOR) 20 MG tablet Take 20 mg by mouth daily with lunch.     . nitroGLYCERIN (NITROSTAT) 0.4 MG SL tablet Place 1 tablet (0.4 mg total) under the tongue every 5 (five) minutes as needed. 25 tablet 3   No facility-administered medications prior to visit.     Allergies:   Beta adrenergic blockers and Morphine   Social History   Socioeconomic History  . Marital status: Married    Spouse name: Not on file  . Number of children: Not on file  .  Years of education: Not on file  . Highest education level: Not on file  Occupational History  . Not on file  Tobacco Use  . Smoking status: Never Smoker  . Smokeless tobacco: Never Used  Substance and Sexual Activity  . Alcohol use: No    Alcohol/week: 0.0 standard drinks  . Drug use: No  . Sexual activity: Not on file  Other Topics Concern  . Not on file  Social History Narrative  . Not on file   Social Determinants of Health   Financial Resource Strain:   . Difficulty of Paying Living Expenses: Not on file  Food Insecurity:   . Worried About Charity fundraiser in the Last Year: Not on file  . Ran Out of Food in the Last Year: Not on file  Transportation Needs:   . Lack of Transportation (Medical): Not on file  . Lack of Transportation (Non-Medical): Not on file  Physical Activity:   . Days of Exercise per Week: Not on file  . Minutes of Exercise per Session: Not on file  Stress:   . Feeling of Stress : Not on file  Social Connections:   . Frequency of Communication with Friends and Family: Not on file  . Frequency of Social Gatherings with Friends and Family: Not on file  . Attends Religious Services: Not on file  . Active Member of Clubs or Organizations: Not on file  . Attends Archivist Meetings: Not on file  . Marital Status: Not on file     Family History:  The patient's family history includes Coronary artery disease in his father, mother, and another family member; Heart attack in his father.   Review of Systems:   Please see the history of present illness.     General:  No chills, fever, night sweats or weight changes.  Cardiovascular:  No chest pain, edema, orthopnea, palpitations, paroxysmal nocturnal dyspnea. Positive for dyspnea on exertion (at baseline).  Dermatological: No rash, lesions/masses Respiratory: No cough, dyspnea Urologic: No hematuria, dysuria Abdominal:   No nausea, vomiting, diarrhea, bright red blood per rectum, melena, or  hematemesis Neurologic:  No visual changes, wkns, changes in mental status. All other systems reviewed and are otherwise negative except as noted above.   Physical Exam:    VS:  BP 138/76   Pulse 85   Temp 97.8 F (36.6 C) (Temporal)   Ht 6' (1.829 m)   Wt 203 lb (92.1 kg)   SpO2 95%   BMI 27.53 kg/m    General: Well developed, well nourished,male appearing in no acute distress. Head: Normocephalic, atraumatic, sclera non-icteric.  Neck: No carotid bruits. JVD not elevated.  Lungs: Respirations regular and unlabored, without wheezes or rales.  Heart: Regular rate and rhythm.  No S3 or S4.  No murmur, no rubs, or gallops appreciated. Abdomen: Soft, non-tender, non-distended. No obvious abdominal masses. Msk:  Strength and tone appear normal for age. No obvious joint deformities or effusions. Extremities: No clubbing or cyanosis. No lower extremity edema.  Distal pedal pulses are 2+ bilaterally. Neuro: Alert and oriented X 3. Moves all extremities spontaneously. No focal deficits noted. Psych:  Responds to questions appropriately with a normal affect. Skin: No rashes or lesions noted  Wt Readings from Last 3 Encounters:  02/11/20 203 lb (92.1 kg)  11/13/19 198 lb (89.8 kg)  11/11/19 198 lb (89.8 kg)     Studies/Labs Reviewed:   EKG:  EKG is not ordered today.   Recent Labs: 11/11/2019: TSH 1.281 11/17/2019: ALT 35; B Natriuretic Peptide 121.0; BUN 13; Creatinine, Ser 0.97; Hemoglobin 11.6; Platelets 142; Potassium 3.2; Sodium 132   Lipid Panel    Component Value Date/Time   TRIG 34 11/17/2019 1057    Additional studies/ records that were reviewed today include:   NST: 05/2018  Defect 1: There is a medium defect of moderate severity present in the mid inferoseptal, mid inferior and apical inferior location.  Defect 2: There is a medium defect of moderate severity present in the mid anteroseptal location.  Findings in defect 1 are consistent with prior myocardial  infarction with peri-infarct ischemia. Findings in defect to are consistent with ischemia  This is an intermediate risk study.  Nuclear stress EF: 70%.  Event Monitor: 11/2019  Sinus rhythm and sinus tachycardia seen, average heart rate 76 bpm. One isolated 4 beat run of NSVT was seen.   Echocardiogram: 07/2018   Assessment:    1. Coronary artery disease involving native coronary artery of native heart without angina pectoris   2. Palpitations   3. Essential hypertension   4. Hyperlipidemia LDL goal <70   5. Anemia, unspecified type      Plan:   In order of problems listed above:  1. CAD - he is s/p CABG in 02/2009 with NST in 05/2018 and intermediate risk and showing areas of some ischemia but medical management was pursued given no change in symptoms. - He reports having baseline dyspnea if overexerting himself but denies any symptoms with routine activities.  No recent chest pain.  We will continue with medical management at this time. - Continue ASA 81 mg daily and Crestor 20 mg daily. He is not on beta-blocker therapy given prior intolerances.  2. Palpitations - recent cardiac event monitor showed sinus rhythm with episodes of sinus tachycardia and one isolated run of 4 beats NSVT but no significant arrhythmias. He denies any recurrent palpitations over the past few months. Remains on short acting Cardizem 30 mg twice daily.  3. HTN - BP initially elevated at 150/68 but improved to 138/78 on recheck. He reports his SBP is typically in the 120's to 130's when checked at home. Continue current medication regimen for now.  4. HLD - Followed by PCP.  He brings with him today the most recent copy of his lab work from 09/2019 which shows total cholesterol 118, triglycerides 44, HDL 41 and LDL at 66. LFT's were within the normal range. Continue Crestor 20 mg daily.  5. Anemia - Hemoglobin was at 12.6 on most recent check which is similar to prior values and actually improved.  No recent melena, hematochezia or hematuria.  He remains on iron supplementation and this is followed by his PCP.  Medication Adjustments/Labs and Tests Ordered: Current  medicines are reviewed at length with the patient today.  Concerns regarding medicines are outlined above.  Medication changes, Labs and Tests ordered today are listed in the Patient Instructions below. Patient Instructions  Medication Instructions:  None. I did refill your nitroglycerin.   Labwork: None  Testing/Procedures: None  Follow-Up: With Dr. Bronson Ing in 6 months.   Any Other Special Instructions Will Be Listed Below (If Applicable).  If you need a refill on your cardiac medications before your next appointment, please call your pharmacy.    Signed, Erma Heritage, PA-C  02/11/2020 3:35 PM    [facility] [address] [place], [place] [address] Phone: [phone] Fax: [phone]

## 2020-02-11 NOTE — Patient Instructions (Addendum)
Medication Instructions:  None. I did refill your nitroglycerin.   Labwork: None  Testing/Procedures: None  Follow-Up: With Dr. Bronson Ing in 6 months.   Any Other Special Instructions Will Be Listed Below (If Applicable).     If you need a refill on your cardiac medications before your next appointment, please call your pharmacy.         Iron-Rich Diet  Iron is a mineral that helps your body to produce hemoglobin. Hemoglobin is a protein in red blood cells that carries oxygen to your body's tissues. Eating too little iron may cause you to feel weak and tired, and it can increase your risk of infection. Iron is naturally found in many foods, and many foods have iron added to them (iron-fortified foods). You may need to follow an iron-rich diet if you do not have enough iron in your body due to certain medical conditions. The amount of iron that you need each day depends on your age, your sex, and any medical conditions you have. Follow instructions from your health care provider or a diet and nutrition specialist (dietitian) about how much iron you should eat each day. What are tips for following this plan? Reading food labels  Check food labels to see how many milligrams (mg) of iron are in each serving. Cooking  Cook foods in pots and pans that are made from iron.  Take these steps to make it easier for your body to absorb iron from certain foods: ? Soak beans overnight before cooking. ? Soak whole grains overnight and drain them before using. ? Ferment flours before baking, such as by using yeast in bread dough. Meal planning  When you eat foods that contain iron, you should eat them with foods that are high in vitamin C. These include oranges, peppers, tomatoes, potatoes, and mango. Vitamin C helps your body to absorb iron. General information  Take iron supplements only as told by your health care provider. An overdose of iron can be life-threatening. If you were  prescribed iron supplements, take them with orange juice or a vitamin C supplement.  When you eat iron-fortified foods or take an iron supplement, you should also eat foods that naturally contain iron, such as meat, poultry, and fish. Eating naturally iron-rich foods helps your body to absorb the iron that is added to other foods or contained in a supplement.  Certain foods and drinks prevent your body from absorbing iron properly. Avoid eating these foods in the same meal as iron-rich foods or with iron supplements. These foods include: ? Coffee, black tea, and red wine. ? Milk, dairy products, and foods that are high in calcium. ? Beans and soybeans. ? Whole grains. What foods should I eat? Fruits Prunes. Raisins. Eat fruits high in vitamin C, such as oranges, grapefruits, and strawberries, alongside iron-rich foods. Vegetables Spinach (cooked). Green peas. Broccoli. Fermented vegetables. Eat vegetables high in vitamin C, such as leafy greens, potatoes, bell peppers, and tomatoes, alongside iron-rich foods. Grains Iron-fortified breakfast cereal. Iron-fortified whole-wheat bread. Enriched rice. Sprouted grains. Meats and other proteins Beef liver. Oysters. Beef. Shrimp. [place]. Chicken. Bowie. Sardines. Chickpeas. Nuts. Tofu. Pumpkin seeds. Beverages Tomato juice. Fresh orange juice. Prune juice. Hibiscus tea. Fortified instant breakfast shakes. Sweets and desserts Blackstrap molasses. Seasonings and condiments Tahini. Fermented soy sauce. Other foods Wheat germ. The items listed above may not be a complete list of recommended foods and beverages. Contact a dietitian for more information. What foods should I avoid? Grains Whole grains. Bran  cereal. Bran flour. Oats. Meats and other proteins Soybeans. Products made from soy protein. Black beans. Lentils. Mung beans. Split peas. Dairy Milk. Cream. Cheese. Yogurt. Cottage cheese. Beverages Coffee. Black tea. Red wine. Sweets and  desserts Cocoa. Chocolate. Ice cream. Other foods Basil. Oregano. Large amounts of parsley. The items listed above may not be a complete list of foods and beverages to avoid. Contact a dietitian for more information. Summary  Iron is a mineral that helps your body to produce hemoglobin. Hemoglobin is a protein in red blood cells that carries oxygen to your body's tissues.  Iron is naturally found in many foods, and many foods have iron added to them (iron-fortified foods).  When you eat foods that contain iron, you should eat them with foods that are high in vitamin C. Vitamin C helps your body to absorb iron.  Certain foods and drinks prevent your body from absorbing iron properly, such as whole grains and dairy products. You should avoid eating these foods in the same meal as iron-rich foods or with iron supplements. This information is not intended to replace advice given to you by your health care provider. Make sure you discuss any questions you have with your health care provider. Document Revised: 11/02/2017 Document Reviewed: 10/16/2017 Elsevier Patient Education  2020 [employer].

## 2020-03-03 DIAGNOSIS — I10 Essential (primary) hypertension: Secondary | ICD-10-CM | POA: Diagnosis not present

## 2020-03-03 DIAGNOSIS — E7849 Other hyperlipidemia: Secondary | ICD-10-CM | POA: Diagnosis not present

## 2020-03-10 DIAGNOSIS — I10 Essential (primary) hypertension: Secondary | ICD-10-CM | POA: Diagnosis not present

## 2020-03-10 DIAGNOSIS — E782 Mixed hyperlipidemia: Secondary | ICD-10-CM | POA: Diagnosis not present

## 2020-03-10 DIAGNOSIS — K219 Gastro-esophageal reflux disease without esophagitis: Secondary | ICD-10-CM | POA: Diagnosis not present

## 2020-03-10 DIAGNOSIS — R5383 Other fatigue: Secondary | ICD-10-CM | POA: Diagnosis not present

## 2020-03-10 DIAGNOSIS — D519 Vitamin B12 deficiency anemia, unspecified: Secondary | ICD-10-CM | POA: Diagnosis not present

## 2020-03-10 DIAGNOSIS — D529 Folate deficiency anemia, unspecified: Secondary | ICD-10-CM | POA: Diagnosis not present

## 2020-03-10 DIAGNOSIS — D649 Anemia, unspecified: Secondary | ICD-10-CM | POA: Diagnosis not present

## 2020-03-17 DIAGNOSIS — E782 Mixed hyperlipidemia: Secondary | ICD-10-CM | POA: Diagnosis not present

## 2020-03-17 DIAGNOSIS — K429 Umbilical hernia without obstruction or gangrene: Secondary | ICD-10-CM | POA: Diagnosis not present

## 2020-03-17 DIAGNOSIS — Z79891 Long term (current) use of opiate analgesic: Secondary | ICD-10-CM | POA: Diagnosis not present

## 2020-03-17 DIAGNOSIS — N400 Enlarged prostate without lower urinary tract symptoms: Secondary | ICD-10-CM | POA: Diagnosis not present

## 2020-03-17 DIAGNOSIS — I25111 Atherosclerotic heart disease of native coronary artery with angina pectoris with documented spasm: Secondary | ICD-10-CM | POA: Diagnosis not present

## 2020-03-17 DIAGNOSIS — I10 Essential (primary) hypertension: Secondary | ICD-10-CM | POA: Diagnosis not present

## 2020-03-17 DIAGNOSIS — D649 Anemia, unspecified: Secondary | ICD-10-CM | POA: Diagnosis not present

## 2020-03-17 DIAGNOSIS — K219 Gastro-esophageal reflux disease without esophagitis: Secondary | ICD-10-CM | POA: Diagnosis not present

## 2020-04-02 DIAGNOSIS — E7849 Other hyperlipidemia: Secondary | ICD-10-CM | POA: Diagnosis not present

## 2020-04-02 DIAGNOSIS — I10 Essential (primary) hypertension: Secondary | ICD-10-CM | POA: Diagnosis not present

## 2020-04-02 DIAGNOSIS — K219 Gastro-esophageal reflux disease without esophagitis: Secondary | ICD-10-CM | POA: Diagnosis not present

## 2020-05-03 DIAGNOSIS — I10 Essential (primary) hypertension: Secondary | ICD-10-CM | POA: Diagnosis not present

## 2020-05-03 DIAGNOSIS — N400 Enlarged prostate without lower urinary tract symptoms: Secondary | ICD-10-CM | POA: Diagnosis not present

## 2020-05-03 DIAGNOSIS — E7849 Other hyperlipidemia: Secondary | ICD-10-CM | POA: Diagnosis not present

## 2020-05-03 DIAGNOSIS — E1165 Type 2 diabetes mellitus with hyperglycemia: Secondary | ICD-10-CM | POA: Diagnosis not present

## 2020-05-08 ENCOUNTER — Encounter (HOSPITAL_COMMUNITY): Payer: Self-pay

## 2020-05-08 ENCOUNTER — Other Ambulatory Visit: Payer: Self-pay

## 2020-05-08 ENCOUNTER — Emergency Department (HOSPITAL_COMMUNITY): Payer: Medicare Other

## 2020-05-08 ENCOUNTER — Emergency Department (HOSPITAL_COMMUNITY)
Admission: EM | Admit: 2020-05-08 | Discharge: 2020-05-08 | Disposition: A | Discharge status: Home / Self Care | Payer: Medicare Other | Attending: Emergency Medicine | Admitting: Emergency Medicine

## 2020-05-08 DIAGNOSIS — I2510 Atherosclerotic heart disease of native coronary artery without angina pectoris: Secondary | ICD-10-CM | POA: Diagnosis not present

## 2020-05-08 DIAGNOSIS — Z951 Presence of aortocoronary bypass graft: Secondary | ICD-10-CM | POA: Insufficient documentation

## 2020-05-08 DIAGNOSIS — M25561 Pain in right knee: Secondary | ICD-10-CM | POA: Diagnosis not present

## 2020-05-08 DIAGNOSIS — Z79899 Other long term (current) drug therapy: Secondary | ICD-10-CM | POA: Diagnosis not present

## 2020-05-08 DIAGNOSIS — Y999 Unspecified external cause status: Secondary | ICD-10-CM | POA: Diagnosis not present

## 2020-05-08 DIAGNOSIS — Y929 Unspecified place or not applicable: Secondary | ICD-10-CM | POA: Insufficient documentation

## 2020-05-08 DIAGNOSIS — M25461 Effusion, right knee: Secondary | ICD-10-CM | POA: Diagnosis not present

## 2020-05-08 DIAGNOSIS — W0110XA Fall on same level from slipping, tripping and stumbling with subsequent striking against unspecified object, initial encounter: Secondary | ICD-10-CM | POA: Insufficient documentation

## 2020-05-08 DIAGNOSIS — Y9301 Activity, walking, marching and hiking: Secondary | ICD-10-CM | POA: Insufficient documentation

## 2020-05-08 MED ORDER — HYDROMORPHONE HCL 2 MG PO TABS: 1.0000 mg | ORAL_TABLET | Freq: Four times a day (QID) | ORAL | 0 refills | Status: AC | PRN

## 2020-05-08 MED ORDER — FENTANYL CITRATE (PF) 100 MCG/2ML IJ SOLN
50.0000 ug | Freq: Once | INTRAMUSCULAR | Status: AC
  Administered 2020-05-08: 50 ug via INTRAMUSCULAR
  Filled 2020-05-08: qty 2

## 2020-05-08 NOTE — ED Triage Notes (Signed)
Patient fell face forward onto concrete Tuesday hitting head and knee. No loc. Complains of increased pain and knee swelling to extremity.

## 2020-05-08 NOTE — ED Provider Notes (Signed)
[facility] EMERGENCY DEPARTMENT Provider Note   CSN: [id number] Arrival date & time: 05/08/20  5643     History No chief complaint on file.   [name] [name] is a 79 y.o. male.  HPI    Presents 5 days after fall, now with concern for ongoing pain in his right knee. Patient has a history of left knee replacement, and known degenerative changes in his right knee. He notes that he was walking, wearing a knee brace, 5 days ago.  He fell, struck the right knee directly, as well as his head. He did sustain an abrasion of his head, but currently has no head pain, neck pain, weakness in any extremity. He presents due to worsening pain, and acute on chronic swelling in the right knee. No distal loss of sensation or weakness. Patient has been attempting therapy with multiple medication, rest.  His therapies have included ibuprofen, Tylenol, narcotics.  With worsening pain he presents for evaluation. Past Medical History:  Diagnosis Date  . CAD (coronary artery disease) of artery bypass graft    1.Status post coronary artery bypass grafting. 2. Normal left ventricular function. 3. Increase left atrial dilatation.  . Hyperlipidemia LDL goal <70   . OA (osteoarthritis) of knee     Patient Active Problem List   Diagnosis Date Noted  . History of colonic polyps 03/29/2017  . DYSLIPIDEMIA 10/15/2009  . SINUS BRADYCARDIA 10/15/2009  . DEGENERATIVE JOINT DISEASE, KNEE 10/15/2009  . CORONARY ARTERY BYPASS GRAFT, HX OF 10/15/2009  . CAD, ARTERY BYPASS GRAFT 09/11/2009    Past Surgical History:  Procedure Laterality Date  . COLONOSCOPY N/A 08/03/2017   Procedure: COLONOSCOPY;  Surgeon: Rogene Houston, MD;  Location: [facility];  Service: Endoscopy;  Laterality: N/A;  730  . CORONARY ARTERY BYPASS GRAFT    . FLEXIBLE SIGMOIDOSCOPY N/A 08/15/2017   Procedure: FLEXIBLE SIGMOIDOSCOPY;  Surgeon: Rogene Houston, MD;  Location: [facility];  Service: Endoscopy;  Laterality:  N/A;  . JOINT REPLACEMENT Left 2012   Left knee  . KNEE ARTHROSCOPY         Family History  Problem Relation Age of Onset  . Coronary artery disease Other   . Coronary artery disease Mother   . Coronary artery disease Father   . Heart attack Father     Social History   Tobacco Use  . Smoking status: Never Smoker  . Smokeless tobacco: Never Used  Substance Use Topics  . Alcohol use: No    Alcohol/week: 0.0 standard drinks  . Drug use: No    Home Medications Prior to Admission medications   Medication Sig Start Date End Date Taking? Authorizing Provider  aspirin 81 MG tablet Take 1 tablet (81 mg total) by mouth every other day. 08/22/17   Rogene Houston, MD  Cholecalciferol (VITAMIN D) 2000 units CAPS Take 1 capsule by mouth daily.    [provider]  diltiazem (CARDIZEM) 30 MG tablet Take 1 tablet (30 mg total) by mouth 2 (two) times daily. 11/11/19   Herminio Commons, MD  ferrous gluconate (FERGON) 324 MG tablet Take 324 mg by mouth 3 (three) times daily with meals.    [provider]  fluticasone (FLONASE) 50 MCG/ACT nasal spray Place 1 spray into the nose at bedtime.  03/22/12   [provider]  loratadine (CLARITIN) 10 MG tablet Take 10 mg by mouth daily.    [provider]  Multiple Vitamin (MULTIVITAMIN) tablet Take 1  tablet by mouth daily.      [provider]  nitroGLYCERIN (NITROSTAT) 0.4 MG SL tablet Place 1 tablet (0.4 mg total) under the tongue every 5 (five) minutes as needed. 02/11/20   Strader, Fransisco Hertz, PA-C  Omega-3 Fatty Acids (FISH OIL) 600 MG CAPS Take 600 mg by mouth daily.    [provider]  oxyCODONE-acetaminophen (PERCOCET/ROXICET) 5-325 MG tablet Take 1 tablet by mouth every 4 (four) hours as needed for severe pain.    [provider]  pantoprazole (PROTONIX) 40 MG tablet Take 40 mg by mouth daily.      [provider]  rosuvastatin (CRESTOR) 20 MG tablet Take 20 mg by mouth  daily with lunch.     [provider]    Allergies    Beta adrenergic blockers and Morphine  Review of Systems   Review of Systems  Constitutional:       Per HPI, otherwise negative  HENT:       Per HPI, otherwise negative  Respiratory:       Per HPI, otherwise negative  Cardiovascular:       Per HPI, otherwise negative  Gastrointestinal: Negative for vomiting.  Endocrine:       Negative aside from HPI  Genitourinary:       Neg aside from HPI   Musculoskeletal:       Per HPI, otherwise negative  Skin: Positive for wound.  Neurological: Negative for syncope.    Physical Exam Updated Vital Signs BP (!) 144/77   Pulse 80   Temp 97.9 F (36.6 C) (Oral)   Resp 18   SpO2 96%   Physical Exam Vitals and nursing note reviewed.  Constitutional:      General: He is not in acute distress.    Appearance: He is well-developed.  HENT:     Head: Normocephalic and atraumatic.  Eyes:     Conjunctiva/sclera: Conjunctivae normal.  Cardiovascular:     Rate and Rhythm: Normal rate and regular rhythm.  Pulmonary:     Effort: Pulmonary effort is normal. No respiratory distress.     Breath sounds: No stridor.  Abdominal:     General: There is no distension.  Musculoskeletal:       Legs:  Skin:    General: Skin is warm and dry.  Neurological:     Mental Status: He is alert and oriented to person, place, and time.     ED Results / Procedures / Treatments   Labs (all labs ordered are listed, but only abnormal results are displayed) Labs Reviewed - No data to display  EKG None  Radiology MR KNEE RIGHT WO CONTRAST  Result Date: 05/08/2020 CLINICAL DATA:  Recent fall landing on the right knee.  Knee pain. EXAM: MRI OF THE RIGHT KNEE WITHOUT CONTRAST TECHNIQUE: Multiplanar, multisequence MR imaging of the knee was performed. No intravenous contrast was administered. COMPARISON:  Radiographs from 05/08/2020 FINDINGS: Despite efforts by the technologist and patient,  markedly severe motion artifact is present on today's exam and could not be eliminated. This reduces exam sensitivity and specificity. MENISCI Medial meniscus: Poor visualization of the midbody and posterior horn with abnormal contour of the anterior horn, probable degenerative tearing. Lateral meniscus:  Diminutive anterior horn, probably torn. LIGAMENTS Cruciates:  ACL not visualized, likely torn.  PCL felt to be intact. Collaterals:  Grossly unremarkable. CARTILAGE Patellofemoral: Severe osteoarthritis with prominent chondral thinning and prominent marginal spurring. Medial: Severe full-thickness loss of articular cartilage. Marginal spurring. Lateral:  Moderate degenerative chondral thinning with marginal spurring. Joint:  Large knee effusion. Popliteal Fossa: Edema tracks along popliteal fascia planes. Etiology uncertain. Extensor Mechanism: There is edema superficial to the lateral patellar retinaculum. Distal patellar tendinopathy. Bones:  No additional significant bony findings. Other: No supplemental non-categorized findings. IMPRESSION: 1. Despite efforts by the technologist and patient, markedly severe motion artifact is present on today's exam and could not be eliminated. This reduces exam sensitivity and specificity. 2. Large knee effusion. 3. Poor visualization of the midbody and posterior horn of the medial meniscus, probable degenerative tearing. 4. Diminutive anterior horn lateral meniscus, probably torn. 5. Nonvisualization of the ACL, favoring tear. 6. Severe tricompartmental osteoarthritis. 7. Distal patellar tendinopathy. 8. Edema tracks along popliteal fascia planes. Etiology uncertain. Electronically Signed   By: Van Clines M.D.   On: 05/08/2020 14:23   DG Knee Complete 4 Views Right  Result Date: 05/08/2020 CLINICAL DATA:  Patient fell face forward onto concrete Tuesday hitting head and knee. No loc. Complains of increased pain and knee swelling to extremity. Pain radiates from  posterior right knee all the way down into the right ankle. Hx of knee surgery EXAM: RIGHT KNEE - COMPLETE 4+ VIEW COMPARISON:  None. FINDINGS: No fracture or bone lesion. There is mild tricompartmental joint space narrowing with prominent marginal osteophytes, particularly from the patella. Large joint effusion. There are few posterior arterial vascular calcifications. Soft tissues are otherwise unremarkable. IMPRESSION: 1. No fracture or dislocation. 2. Tricompartmental osteoarthritis. Large joint effusion. The large joint effusion raises suspicion for an acute internal derangement. Consider follow-up right knee MRI for further assessment. Electronically Signed   By: Lajean Manes M.D.   On: 05/08/2020 10:27    Procedures Procedures (including critical care time)  Medications Ordered in ED Medications  fentaNYL (SUBLIMAZE) injection 50 mcg (50 mcg Intramuscular Given 05/08/20 1240)    ED Course  I have reviewed the triage vital signs and the nursing notes.  Pertinent labs & imaging results that were available during my care of the patient were reviewed by me and considered in my medical decision making (see chart for details).    MDM Rules/Calculators/A&P                      2:55 PM Patient awake, alert, speaking clearly.  He states that he is more comfortable after receiving analgesia here. I have reviewed the MRI results, and I discussed with the patient and his wife.  With consideration of multiple soft tissue injuries including ACL tear, meniscal tear, effusion, the patient will require outpatient orthopedic follow-up, to which he is amenable. He will receive immobilization here, notes that he has a walker and crutches at home.  Given the absence of distal neurovascular compromise, other complaint, no indication for additional imaging, labs. After mobilization, analgesia, the patient was discharged in stable condition.  She is already on a narcotic, will require a slightly increased  potency pending outpatient follow-up. Final Clinical Impression(s) / ED Diagnoses Final diagnoses:  Acute pain of right knee    Rx / DC Orders ED Discharge Orders         Ordered    HYDROmorphone (DILAUDID) 2 MG tablet  Every 6 hours PRN     05/08/20 1459           Carmin Muskrat, MD 05/08/20 1500

## 2020-05-08 NOTE — ED Notes (Signed)
Patient given discharge instructions. Questions were answered. Patient verbalized understanding of discharge instructions and care at home.  Knee immobilizer placed on Right leg by Deidre Ala, RN

## 2020-05-08 NOTE — ED Notes (Signed)
Patient transported to MRI 

## 2020-05-08 NOTE — Discharge Instructions (Addendum)
As discussed, you have been diagnosed with knee pain. This is likely due to disruption of ligaments and other soft tissue structures.  It is important to monitor your condition carefully, and follow-up with our orthopedic colleagues.  Return here for concerning changes in your condition.  Your MRI results are below:  MR KNEE RIGHT WO CONTRAST  Result Date: 05/08/2020 CLINICAL DATA:  Recent fall landing on the right knee.  Knee pain. EXAM: MRI OF THE RIGHT KNEE WITHOUT CONTRAST TECHNIQUE: Multiplanar, multisequence MR imaging of the knee was performed. No intravenous contrast was administered. COMPARISON:  Radiographs from 05/08/2020 FINDINGS: Despite efforts by the technologist and patient, markedly severe motion artifact is present on today's exam and could not be eliminated. This reduces exam sensitivity and specificity. MENISCI Medial meniscus: Poor visualization of the midbody and posterior horn with abnormal contour of the anterior horn, probable degenerative tearing. Lateral meniscus:  Diminutive anterior horn, probably torn. LIGAMENTS Cruciates:  ACL not visualized, likely torn.  PCL felt to be intact. Collaterals:  Grossly unremarkable. CARTILAGE Patellofemoral: Severe osteoarthritis with prominent chondral thinning and prominent marginal spurring. Medial: Severe full-thickness loss of articular cartilage. Marginal spurring. Lateral: Moderate degenerative chondral thinning with marginal spurring. Joint:  Large knee effusion. Popliteal Fossa: Edema tracks along popliteal fascia planes. Etiology uncertain. Extensor Mechanism: There is edema superficial to the lateral patellar retinaculum. Distal patellar tendinopathy. Bones:  No additional significant bony findings. Other: No supplemental non-categorized findings. IMPRESSION: 1. Despite efforts by the technologist and patient, markedly severe motion artifact is present on today's exam and could not be eliminated. This reduces exam sensitivity and  specificity. 2. Large knee effusion. 3. Poor visualization of the midbody and posterior horn of the medial meniscus, probable degenerative tearing. 4. Diminutive anterior horn lateral meniscus, probably torn. 5. Nonvisualization of the ACL, favoring tear. 6. Severe tricompartmental osteoarthritis. 7. Distal patellar tendinopathy. 8. Edema tracks along popliteal fascia planes. Etiology uncertain. Electronically Signed   By: Van Clines M.D.   On: 05/08/2020 14:23

## 2020-05-10 DIAGNOSIS — M25561 Pain in right knee: Secondary | ICD-10-CM | POA: Diagnosis not present

## 2020-06-18 DIAGNOSIS — M25561 Pain in right knee: Secondary | ICD-10-CM | POA: Diagnosis not present

## 2020-07-02 DIAGNOSIS — I10 Essential (primary) hypertension: Secondary | ICD-10-CM | POA: Diagnosis not present

## 2020-07-02 DIAGNOSIS — E7849 Other hyperlipidemia: Secondary | ICD-10-CM | POA: Diagnosis not present

## 2020-07-02 DIAGNOSIS — E1165 Type 2 diabetes mellitus with hyperglycemia: Secondary | ICD-10-CM | POA: Diagnosis not present

## 2020-07-02 DIAGNOSIS — N400 Enlarged prostate without lower urinary tract symptoms: Secondary | ICD-10-CM | POA: Diagnosis not present

## 2020-07-21 ENCOUNTER — Ambulatory Visit (INDEPENDENT_AMBULATORY_CARE_PROVIDER_SITE_OTHER): Payer: Medicare Other | Admitting: Student

## 2020-07-21 ENCOUNTER — Encounter: Payer: Self-pay | Admitting: Student

## 2020-07-21 ENCOUNTER — Other Ambulatory Visit: Payer: Self-pay

## 2020-07-21 VITALS — BP 130/70 | HR 71 | Ht 72.0 in | Wt 203.0 lb

## 2020-07-21 DIAGNOSIS — R002 Palpitations: Secondary | ICD-10-CM

## 2020-07-21 DIAGNOSIS — I10 Essential (primary) hypertension: Secondary | ICD-10-CM | POA: Diagnosis not present

## 2020-07-21 DIAGNOSIS — E785 Hyperlipidemia, unspecified: Secondary | ICD-10-CM | POA: Diagnosis not present

## 2020-07-21 DIAGNOSIS — I25810 Atherosclerosis of coronary artery bypass graft(s) without angina pectoris: Secondary | ICD-10-CM

## 2020-07-21 MED ORDER — NITROGLYCERIN 0.4 MG SL SUBL: 0.4000 mg | SUBLINGUAL_TABLET | SUBLINGUAL | 3 refills | Status: DC | PRN

## 2020-07-21 NOTE — Patient Instructions (Signed)
Medication Instructions:  No changes.   Labwork: None. Thank you for bringing a copy of your recent labs!  Testing/Procedures: None.  Follow-Up: In 6 months with Bernerd Pho, PA-C  Any Other Special Instructions Will Be Listed Below (If Applicable).     If you need a refill on your cardiac medications before your next appointment, please call your pharmacy.

## 2020-07-21 NOTE — Progress Notes (Signed)
Cardiology Office Note    Date:  07/21/2020   ID:  Kieffer, Blatz 1941-11-17, MRN [id number]  PCP:  Caryl Bis, MD  Cardiologist: Kate Sable, MD (Inactive)    Chief Complaint  Patient presents with  . Follow-up    6 month visit    History of Present Illness:    KORREY SCHLEICHER is a 79 y.o. male with past medical history of CAD (s/p CABG in 02/2009, intermediate-risk NST in 05/2018 with medical management recommended), palpitations (episodes of sinus tachycardia and isolated NSVT by prior monitor), HTN and HLD who presents to the office today for 70-month follow-up.  He was last examined by myself in 02/2020 and reported overall doing well at that time, denying any recent anginal symptoms. He was continued on ASA and statin therapy. He had previously been intolerant to beta-blocker therapy and was continued on short-acting Cardizem 30 mg twice daily.  In talking with the patient today, he reports overall doing well from a cardiac perspective since his last office visit. He denies any recent chest pain or dyspnea on exertion. He stays active as him and his wife are currently raising his grandchildren and he reports walking around with them on the property and going fishing frequently. Denies any anginal symptoms when walking up inclines. No recent palpitations, orthopnea, PND or lower extremity edema. He did fall on his right knee a few months ago and is being followed by Orthopedics. Says that his pain has improved with fluid being drawn off the knee and with steroid injections. No anticipated surgery at this time.   Past Medical History:  Diagnosis Date  . CAD (coronary artery disease) of artery bypass graft    a. s/p CABG in 02/2009 b. intermediate-risk NST in 05/2018 with medical management recommended  . Hyperlipidemia LDL goal <70   . OA (osteoarthritis) of knee     Past Surgical History:  Procedure Laterality Date  . COLONOSCOPY N/A 08/03/2017   Procedure:  COLONOSCOPY;  Surgeon: Rogene Houston, MD;  Location: [facility];  Service: Endoscopy;  Laterality: N/A;  730  . CORONARY ARTERY BYPASS GRAFT    . FLEXIBLE SIGMOIDOSCOPY N/A 08/15/2017   Procedure: FLEXIBLE SIGMOIDOSCOPY;  Surgeon: Rogene Houston, MD;  Location: [facility];  Service: Endoscopy;  Laterality: N/A;  . JOINT REPLACEMENT Left 2012   Left knee  . KNEE ARTHROSCOPY      Current Medications: Outpatient Medications Prior to Visit  Medication Sig Dispense Refill  . aspirin 81 MG tablet Take 1 tablet (81 mg total) by mouth every other day. 30 tablet   . Cholecalciferol (VITAMIN D) 2000 units CAPS Take 1 capsule by mouth daily.    Marland Kitchen diltiazem (CARDIZEM) 30 MG tablet Take 1 tablet (30 mg total) by mouth 2 (two) times daily. 60 tablet 11  . ferrous gluconate (FERGON) 324 MG tablet Take 324 mg by mouth 2 (two) times daily.     . fluticasone (FLONASE) 50 MCG/ACT nasal spray Place 1 spray into the nose at bedtime.     Marland Kitchen loratadine (CLARITIN) 10 MG tablet Take 10 mg by mouth daily.    . Multiple Vitamin (MULTIVITAMIN) tablet Take 1 tablet by mouth daily.      . Omega-3 Fatty Acids (FISH OIL) 600 MG CAPS Take 600 mg by mouth daily.    Marland Kitchen oxyCODONE-acetaminophen (PERCOCET/ROXICET) 5-325 MG tablet Take 1 tablet by mouth every 4 (four) hours as needed for severe pain.    Marland Kitchen  pantoprazole (PROTONIX) 40 MG tablet Take 40 mg by mouth daily.      . rosuvastatin (CRESTOR) 20 MG tablet Take 20 mg by mouth daily with lunch.     . nitroGLYCERIN (NITROSTAT) 0.4 MG SL tablet Place 1 tablet (0.4 mg total) under the tongue every 5 (five) minutes as needed. 25 tablet 3   No facility-administered medications prior to visit.     Allergies:   Beta adrenergic blockers and Morphine   Social History   Socioeconomic History  . Marital status: Married    Spouse name: Not on file  . Number of children: Not on file  . Years of education: Not on file  . Highest education level: Not on file    Occupational History  . Not on file  Tobacco Use  . Smoking status: Never Smoker  . Smokeless tobacco: Never Used  Vaping Use  . Vaping Use: Never used  Substance and Sexual Activity  . Alcohol use: No    Alcohol/week: 0.0 standard drinks  . Drug use: No  . Sexual activity: Not on file  Other Topics Concern  . Not on file  Social History Narrative  . Not on file   Social Determinants of Health   Financial Resource Strain:   . Difficulty of Paying Living Expenses:   Food Insecurity:   . Worried About Charity fundraiser in the Last Year:   . Arboriculturist in the Last Year:   Transportation Needs:   . Film/video editor (Medical):   Marland Kitchen Lack of Transportation (Non-Medical):   Physical Activity:   . Days of Exercise per Week:   . Minutes of Exercise per Session:   Stress:   . Feeling of Stress :   Social Connections:   . Frequency of Communication with Friends and Family:   . Frequency of Social Gatherings with Friends and Family:   . Attends Religious Services:   . Active Member of Clubs or Organizations:   . Attends Archivist Meetings:   Marland Kitchen Marital Status:      Family History:  The patient's family history includes Coronary artery disease in his father, mother, and another family member; Heart attack in his father.   Review of Systems:   Please see the history of present illness.     General:  No chills, fever, night sweats or weight changes. Positive for knee pain.  Cardiovascular:  No chest pain, dyspnea on exertion, edema, orthopnea, palpitations, paroxysmal nocturnal dyspnea. Dermatological: No rash, lesions/masses Respiratory: No cough, dyspnea Urologic: No hematuria, dysuria Abdominal:   No nausea, vomiting, diarrhea, bright red blood per rectum, melena, or hematemesis Neurologic:  No visual changes, wkns, changes in mental status. All other systems reviewed and are otherwise negative except as noted above.   Physical Exam:    VS:  BP  130/70   Pulse 71   Ht 6' (1.829 m)   Wt 203 lb (92.1 kg)   SpO2 98%   BMI 27.53 kg/m    General: Well developed, well nourished,male appearing in no acute distress. Head: Normocephalic, atraumatic. Neck: No carotid bruits. JVD not elevated.  Lungs: Respirations regular and unlabored, without wheezes or rales.  Heart: Regular rate and rhythm. No S3 or S4.  No murmur, no rubs, or gallops appreciated. Abdomen: Appears non-distended. No obvious abdominal masses. Msk:  Strength and tone appear normal for age. No obvious joint deformities or effusions. Extremities: No clubbing or cyanosis. No lower extremity edema.  Distal  pedal pulses are 2+ bilaterally. Neuro: Alert and oriented X 3. Moves all extremities spontaneously. No focal deficits noted. Psych:  Responds to questions appropriately with a normal affect. Skin: No rashes or lesions noted  Wt Readings from Last 3 Encounters:  07/21/20 203 lb (92.1 kg)  02/11/20 203 lb (92.1 kg)  11/13/19 198 lb (89.8 kg)     Studies/Labs Reviewed:   EKG:  EKG is not ordered today.    Recent Labs: 11/11/2019: TSH 1.281 11/17/2019: ALT 35; B Natriuretic Peptide 121.0; BUN 13; Creatinine, Ser 0.97; Hemoglobin 11.6; Platelets 142; Potassium 3.2; Sodium 132   Lipid Panel    Component Value Date/Time   TRIG 34 11/17/2019 1057    Additional studies/ records that were reviewed today include:   Echocardiogram: 07/2018   NST: 05/2018  Defect 1: There is a medium defect of moderate severity present in the mid inferoseptal, mid inferior and apical inferior location.  Defect 2: There is a medium defect of moderate severity present in the mid anteroseptal location.  Findings in defect 1 are consistent with prior myocardial infarction with peri-infarct ischemia. Findings in defect to are consistent with ischemia  This is an intermediate risk study.  Nuclear stress EF: 70%.   Event Monitor: 12/2019  Sinus rhythm and sinus tachycardia seen,  average heart rate 76 bpm. One isolated 4 beat run of NSVT was seen.   Assessment:    1. Coronary artery disease involving coronary bypass graft of native heart without angina pectoris   2. Palpitations   3. Essential hypertension   4. Hyperlipidemia LDL goal <70      Plan:   In order of problems listed above:  1. CAD - He is s/p CABG in 02/2009. He did have an intermediate-risk NST in 05/2018 with medical management recommended at that time.  - He denies any recent chest pain or dyspnea on exertion. Continue with medical management at this time. He remains on ASA 81mg  daily and Crestor 20mg  daily.   2. Palpitations - His prior monitor showed episodes of sinus tachycardia and isolated NSVT. He denies any recent symptoms. Remains on short-acting Cardizem 30mg  BID. Previously intolerant to BB therapy.   3. HTN - BP is well-controlled at 130/70 during today's visit. Continue current medication regimen.   4. HLD - Labs from his PCP which were obtained in 03/2020 showed total cholesterol 117, triglycerides 53, HDL 42 and LDL 63. At goal of LDL less than 70. Continue Crestor 20mg  daily.    Medication Adjustments/Labs and Tests Ordered: Current medicines are reviewed at length with the patient today.  Concerns regarding medicines are outlined above.  Medication changes, Labs and Tests ordered today are listed in the Patient Instructions below. Patient Instructions  Medication Instructions:  No changes.   Labwork: None. Thank you for bringing a copy of your recent labs!  Testing/Procedures: None.  Follow-Up: In 6 months with Bernerd Pho, PA-C  Any Other Special Instructions Will Be Listed Below (If Applicable).     If you need a refill on your cardiac medications before your next appointment, please call your pharmacy.      Signed, Erma Heritage, PA-C  07/21/2020 8:51 PM    [facility] [address] [place], [place] [address] Phone:  [phone] Fax: [phone]

## 2020-08-03 DIAGNOSIS — E1165 Type 2 diabetes mellitus with hyperglycemia: Secondary | ICD-10-CM | POA: Diagnosis not present

## 2020-08-03 DIAGNOSIS — E7849 Other hyperlipidemia: Secondary | ICD-10-CM | POA: Diagnosis not present

## 2020-08-03 DIAGNOSIS — I10 Essential (primary) hypertension: Secondary | ICD-10-CM | POA: Diagnosis not present

## 2020-08-03 DIAGNOSIS — N400 Enlarged prostate without lower urinary tract symptoms: Secondary | ICD-10-CM | POA: Diagnosis not present

## 2020-08-03 DIAGNOSIS — Z720 Tobacco use: Secondary | ICD-10-CM | POA: Diagnosis not present

## 2020-08-11 ENCOUNTER — Ambulatory Visit: Payer: Medicare Other | Admitting: Cardiovascular Disease

## 2020-09-02 DIAGNOSIS — N400 Enlarged prostate without lower urinary tract symptoms: Secondary | ICD-10-CM | POA: Diagnosis not present

## 2020-09-02 DIAGNOSIS — E1165 Type 2 diabetes mellitus with hyperglycemia: Secondary | ICD-10-CM | POA: Diagnosis not present

## 2020-09-02 DIAGNOSIS — E7849 Other hyperlipidemia: Secondary | ICD-10-CM | POA: Diagnosis not present

## 2020-09-02 DIAGNOSIS — I10 Essential (primary) hypertension: Secondary | ICD-10-CM | POA: Diagnosis not present

## 2020-09-22 DIAGNOSIS — I10 Essential (primary) hypertension: Secondary | ICD-10-CM | POA: Diagnosis not present

## 2020-09-22 DIAGNOSIS — D649 Anemia, unspecified: Secondary | ICD-10-CM | POA: Diagnosis not present

## 2020-09-22 DIAGNOSIS — E782 Mixed hyperlipidemia: Secondary | ICD-10-CM | POA: Diagnosis not present

## 2020-09-22 DIAGNOSIS — D529 Folate deficiency anemia, unspecified: Secondary | ICD-10-CM | POA: Diagnosis not present

## 2020-09-22 DIAGNOSIS — D519 Vitamin B12 deficiency anemia, unspecified: Secondary | ICD-10-CM | POA: Diagnosis not present

## 2020-09-22 DIAGNOSIS — Z79891 Long term (current) use of opiate analgesic: Secondary | ICD-10-CM | POA: Diagnosis not present

## 2020-09-22 DIAGNOSIS — E1165 Type 2 diabetes mellitus with hyperglycemia: Secondary | ICD-10-CM | POA: Diagnosis not present

## 2020-09-29 DIAGNOSIS — E782 Mixed hyperlipidemia: Secondary | ICD-10-CM | POA: Diagnosis not present

## 2020-09-29 DIAGNOSIS — Z79891 Long term (current) use of opiate analgesic: Secondary | ICD-10-CM | POA: Diagnosis not present

## 2020-09-29 DIAGNOSIS — K429 Umbilical hernia without obstruction or gangrene: Secondary | ICD-10-CM | POA: Diagnosis not present

## 2020-09-29 DIAGNOSIS — I10 Essential (primary) hypertension: Secondary | ICD-10-CM | POA: Diagnosis not present

## 2020-09-29 DIAGNOSIS — R4582 Worries: Secondary | ICD-10-CM | POA: Diagnosis not present

## 2020-09-29 DIAGNOSIS — N400 Enlarged prostate without lower urinary tract symptoms: Secondary | ICD-10-CM | POA: Diagnosis not present

## 2020-09-29 DIAGNOSIS — Z0001 Encounter for general adult medical examination with abnormal findings: Secondary | ICD-10-CM | POA: Diagnosis not present

## 2020-09-29 DIAGNOSIS — I25111 Atherosclerotic heart disease of native coronary artery with angina pectoris with documented spasm: Secondary | ICD-10-CM | POA: Diagnosis not present

## 2020-09-30 DIAGNOSIS — D649 Anemia, unspecified: Secondary | ICD-10-CM | POA: Diagnosis not present

## 2020-09-30 DIAGNOSIS — D529 Folate deficiency anemia, unspecified: Secondary | ICD-10-CM | POA: Diagnosis not present

## 2020-09-30 DIAGNOSIS — D519 Vitamin B12 deficiency anemia, unspecified: Secondary | ICD-10-CM | POA: Diagnosis not present

## 2020-10-02 DIAGNOSIS — E7849 Other hyperlipidemia: Secondary | ICD-10-CM | POA: Diagnosis not present

## 2020-10-02 DIAGNOSIS — I10 Essential (primary) hypertension: Secondary | ICD-10-CM | POA: Diagnosis not present

## 2020-10-02 DIAGNOSIS — E1165 Type 2 diabetes mellitus with hyperglycemia: Secondary | ICD-10-CM | POA: Diagnosis not present

## 2020-10-10 IMAGING — DX DG CHEST 1V PORT
1 series · 1 of 1 positions shown · non-contrast
Comparison: Chest x-ray 03/12/2009

CLINICAL DATA: Fever of unknown origin.

EXAM:
PORTABLE CHEST 1 VIEW

[chest ap]
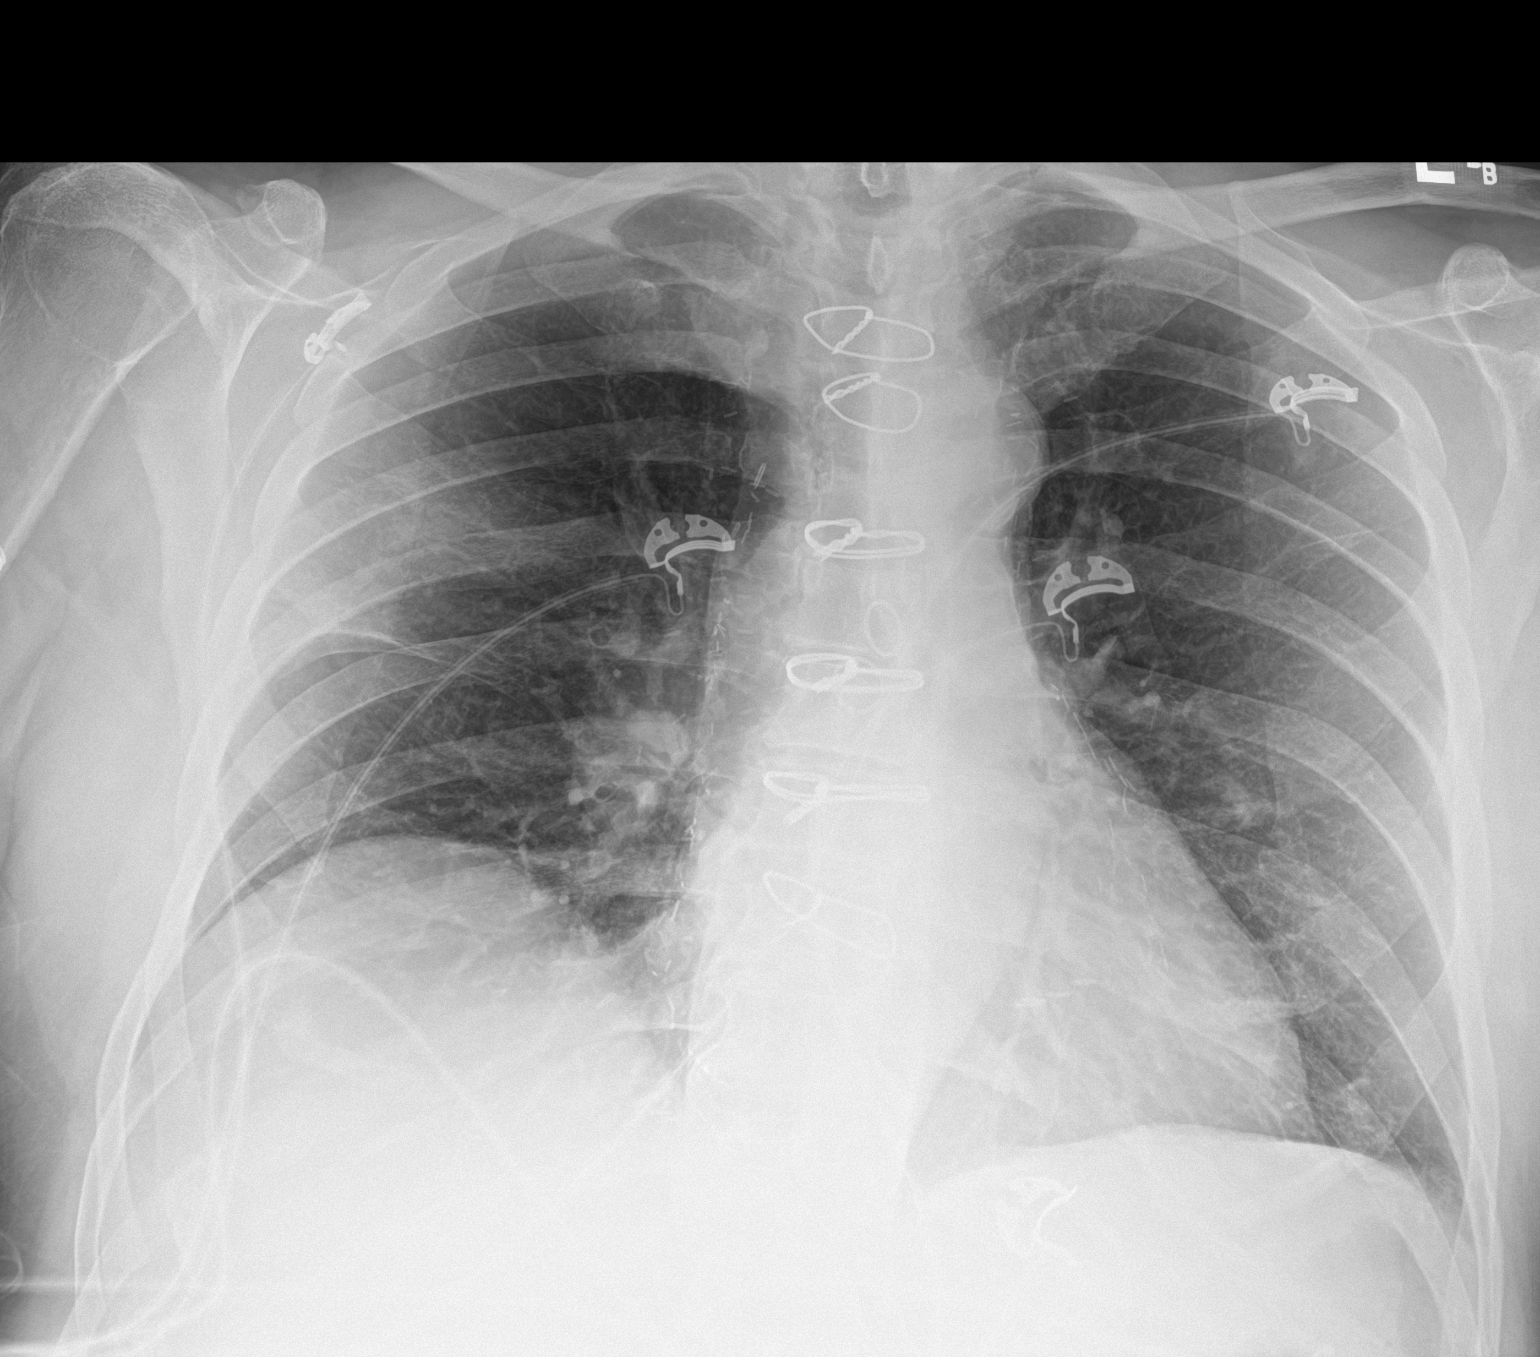

[1 of 1 positions shown; findings below may reference images not displayed]

FINDINGS: The cardiac silhouette, mediastinal and hilar contours are within
normal limits given the AP projection and portable technique. Stable
surgical changes from bypass surgery are noted. There is mild
tortuosity and calcification of the thoracic aorta. Moderate
eventration of the right hemidiaphragm with overlying vascular
crowding and streaky atelectasis.

Patchy density in the right upper lobe suspicious for pneumonia.
IMPRESSION: Patchy right upper lobe airspace opacity suspicious for pneumonia.
Followup PA and lateral chest X-ray is recommended in 3-4 weeks
following trial of antibiotic therapy to ensure resolution and
exclude underlying malignancy.

## 2020-11-02 DIAGNOSIS — E1165 Type 2 diabetes mellitus with hyperglycemia: Secondary | ICD-10-CM | POA: Diagnosis not present

## 2020-11-02 DIAGNOSIS — E7849 Other hyperlipidemia: Secondary | ICD-10-CM | POA: Diagnosis not present

## 2020-11-02 DIAGNOSIS — I10 Essential (primary) hypertension: Secondary | ICD-10-CM | POA: Diagnosis not present

## 2020-11-12 ENCOUNTER — Other Ambulatory Visit: Payer: Self-pay

## 2020-11-12 MED ORDER — DILTIAZEM HCL 30 MG PO TABS: 30.0000 mg | ORAL_TABLET | Freq: Two times a day (BID) | ORAL | 11 refills | Status: DC

## 2020-11-12 NOTE — Telephone Encounter (Signed)
Refilled diltiazem 

## 2020-12-03 DIAGNOSIS — N400 Enlarged prostate without lower urinary tract symptoms: Secondary | ICD-10-CM | POA: Diagnosis not present

## 2020-12-03 DIAGNOSIS — E7849 Other hyperlipidemia: Secondary | ICD-10-CM | POA: Diagnosis not present

## 2020-12-03 DIAGNOSIS — I10 Essential (primary) hypertension: Secondary | ICD-10-CM | POA: Diagnosis not present

## 2020-12-03 DIAGNOSIS — E1165 Type 2 diabetes mellitus with hyperglycemia: Secondary | ICD-10-CM | POA: Diagnosis not present

## 2021-01-01 DIAGNOSIS — N400 Enlarged prostate without lower urinary tract symptoms: Secondary | ICD-10-CM | POA: Diagnosis not present

## 2021-01-01 DIAGNOSIS — E7849 Other hyperlipidemia: Secondary | ICD-10-CM | POA: Diagnosis not present

## 2021-01-01 DIAGNOSIS — I10 Essential (primary) hypertension: Secondary | ICD-10-CM | POA: Diagnosis not present

## 2021-01-01 DIAGNOSIS — E1165 Type 2 diabetes mellitus with hyperglycemia: Secondary | ICD-10-CM | POA: Diagnosis not present

## 2021-01-31 DIAGNOSIS — I10 Essential (primary) hypertension: Secondary | ICD-10-CM | POA: Diagnosis not present

## 2021-01-31 DIAGNOSIS — E7849 Other hyperlipidemia: Secondary | ICD-10-CM | POA: Diagnosis not present

## 2021-01-31 DIAGNOSIS — N400 Enlarged prostate without lower urinary tract symptoms: Secondary | ICD-10-CM | POA: Diagnosis not present

## 2021-01-31 DIAGNOSIS — E1165 Type 2 diabetes mellitus with hyperglycemia: Secondary | ICD-10-CM | POA: Diagnosis not present

## 2021-02-16 ENCOUNTER — Encounter: Payer: Self-pay | Admitting: Student

## 2021-02-16 ENCOUNTER — Other Ambulatory Visit: Payer: Self-pay

## 2021-02-16 ENCOUNTER — Ambulatory Visit (INDEPENDENT_AMBULATORY_CARE_PROVIDER_SITE_OTHER): Payer: Medicare Other | Admitting: Student

## 2021-02-16 VITALS — BP 134/70 | HR 64 | Ht 72.0 in | Wt 209.0 lb

## 2021-02-16 DIAGNOSIS — I25810 Atherosclerosis of coronary artery bypass graft(s) without angina pectoris: Secondary | ICD-10-CM | POA: Diagnosis not present

## 2021-02-16 DIAGNOSIS — R002 Palpitations: Secondary | ICD-10-CM | POA: Diagnosis not present

## 2021-02-16 DIAGNOSIS — I10 Essential (primary) hypertension: Secondary | ICD-10-CM

## 2021-02-16 DIAGNOSIS — E785 Hyperlipidemia, unspecified: Secondary | ICD-10-CM

## 2021-02-16 DIAGNOSIS — L309 Dermatitis, unspecified: Secondary | ICD-10-CM

## 2021-02-16 NOTE — Patient Instructions (Signed)
Medication Instructions:  Your physician recommends that you continue on your current medications as directed. Please refer to the Current Medication list given to you today.  *If you need a refill on your cardiac medications before your next appointment, please call your pharmacy*   Lab Work: None today If you have labs (blood work) drawn today and your tests are completely normal, you will receive your results only by: Marland Kitchen MyChart Message (if you have MyChart) OR . A paper copy in the mail If you have any lab test that is abnormal or we need to change your treatment, we will call you to review the results.   Testing/Procedures: None today   Follow-Up: At [facility], you and your health needs are our priority.  As part of our continuing mission to provide you with exceptional heart care, we have created designated Provider Care Teams.  These Care Teams include your primary Cardiologist (physician) and Advanced Practice Providers (APPs -  Physician Assistants and Nurse Practitioners) who all work together to provide you with the care you need, when you need it.  We recommend signing up for the patient portal called "MyChart".  Sign up information is provided on this After Visit Summary.  MyChart is used to connect with patients for Virtual Visits (Telemedicine).  Patients are able to view lab/test results, encounter notes, upcoming appointments, etc.  Non-urgent messages can be sent to your provider as well.   To learn more about what you can do with MyChart, go to [email or web address].    Your next appointment:   6 month(s)  The format for your next appointment:   In Person  Provider:   Bernerd Pho, PA-C   Other Instructions None        Thank you for choosing [facility] !

## 2021-02-16 NOTE — Progress Notes (Signed)
Cardiology Office Note    Date:  02/16/2021   ID:  [name], [name] 1941/01/02, MRN [id number]  PCP:  [name] Bis, MD  Cardiologist: [name] Sable, MD (Inactive)  --> Needs to switch to new MD  Chief Complaint  Patient presents with  . Follow-up    6 month visit    History of Present Illness:    [name] [name] is a 80 y.o. male with past medical history of CAD (s/pCABG in 02/2009, intermediate-risk NST in 05/2018 with medical management recommended),palpitations (episodes of sinus tachycardia and isolated NSVT by prior monitor), HTN and HLD who presents to the office today for 69-month follow-up.  He was examined by myself in 07/2020 and denied any recent chest pain or dyspnea on exertion. He was active at baseline as him and his wife were raising their grandchildren. He was continued on his current medication regimen including ASA 81 mg daily, Crestor 20 mg daily, short-acting Cardizem 30 mg twice daily (previously intolerant to beta-blocker therapy).  In talking with the patient today, he reports overall doing well from a cardiac perspective since his last visit. He denies any recent chest pain or dyspnea on exertion. No recent orthopnea, PND, lower extremity edema or palpitations. He does remain active at baseline as he is raising his grandchildren and also works a few days a week at [employer]. He does report nocturia and says he was previously on medications for an enlarged prostate but did not tolerate them well. He does report intermittent itchy spots along his skin over the past several months and has been using over-the-counter Hydrocortisone cream with improvement in his symptoms. No change in soaps, detergents or medications.    Past Medical History:  Diagnosis Date  . CAD (coronary artery disease) of artery bypass graft    a. s/p CABG in 02/2009 b. intermediate-risk NST in 05/2018 with medical management recommended  . Hyperlipidemia LDL goal <70   . OA  (osteoarthritis) of knee     Past Surgical History:  Procedure Laterality Date  . COLONOSCOPY N/A 08/03/2017   Procedure: COLONOSCOPY;  Surgeon: Rogene Houston, MD;  [facility];  Service: Endoscopy;  Laterality: N/A;  730  . CORONARY ARTERY BYPASS GRAFT    . FLEXIBLE SIGMOIDOSCOPY N/A 08/15/2017   Procedure: FLEXIBLE SIGMOIDOSCOPY;  Surgeon: Rogene Houston, MD;  Location: [facility];  Service: Endoscopy;  Laterality: N/A;  . JOINT REPLACEMENT Left 2012   Left knee  . KNEE ARTHROSCOPY      Current Medications: Outpatient Medications Prior to Visit  Medication Sig Dispense Refill  . aspirin 81 MG tablet Take 1 tablet (81 mg total) by mouth every other day. 30 tablet   . Cholecalciferol (VITAMIN D) 2000 units CAPS Take 1 capsule by mouth daily.    Marland Kitchen diltiazem (CARDIZEM) 30 MG tablet Take 1 tablet (30 mg total) by mouth 2 (two) times daily. 60 tablet 11  . FERREX 150 150 MG capsule     . fluticasone (FLONASE) 50 MCG/ACT nasal spray Place 1 spray into the nose at bedtime.     Marland Kitchen loratadine (CLARITIN) 10 MG tablet Take 10 mg by mouth daily.    . Multiple Vitamin (MULTIVITAMIN) tablet Take 1 tablet by mouth daily.    . nitroGLYCERIN (NITROSTAT) 0.4 MG SL tablet Place 1 tablet (0.4 mg total) under the tongue every 5 (five) minutes as needed. 25 tablet 3  . Omega-3 Fatty Acids (FISH OIL) 600 MG CAPS  Take 600 mg by mouth daily.    Marland Kitchen oxyCODONE-acetaminophen (PERCOCET/ROXICET) 5-325 MG tablet Take 1 tablet by mouth every 4 (four) hours as needed for severe pain.    . pantoprazole (PROTONIX) 40 MG tablet Take 40 mg by mouth daily.    . rosuvastatin (CRESTOR) 20 MG tablet Take 20 mg by mouth daily with lunch.     . ferrous gluconate (FERGON) 324 MG tablet Take 324 mg by mouth 2 (two) times daily.      No facility-administered medications prior to visit.     Allergies:   Beta adrenergic blockers and Morphine   Social History   Socioeconomic History  . Marital status: Married     Spouse name: Not on file  . Number of children: Not on file  . Years of education: Not on file  . Highest education level: Not on file  Occupational History  . Not on file  Tobacco Use  . Smoking status: Never Smoker  . Smokeless tobacco: Never Used  Vaping Use  . Vaping Use: Never used  Substance and Sexual Activity  . Alcohol use: No    Alcohol/week: 0.0 standard drinks  . Drug use: No  . Sexual activity: Not on file  Other Topics Concern  . Not on file  Social History Narrative  . Not on file   Social Determinants of Health   Financial Resource Strain: Not on file  Food Insecurity: Not on file  Transportation Needs: Not on file  Physical Activity: Not on file  Stress: Not on file  Social Connections: Not on file     Family History:  The patient's family history includes Coronary artery disease in his father, mother, and another family member; Heart attack in his father.   Review of Systems:   Please see the history of present illness.     General:  No chills, fever, night sweats or weight changes.  Cardiovascular:  No chest pain, dyspnea on exertion, edema, orthopnea, palpitations, paroxysmal nocturnal dyspnea. Dermatological: No lesions/masses. Positive for itchy skin.  Respiratory: No cough, dyspnea Urologic: No hematuria. Positive for nocturia.  Abdominal:   No nausea, vomiting, diarrhea, bright red blood per rectum, melena, or hematemesis Neurologic:  No visual changes, wkns, changes in mental status. All other systems reviewed and are otherwise negative except as noted above.   Physical Exam:    VS:  BP 134/70   Pulse 64   Ht 6' (1.829 m)   Wt 209 lb (94.8 kg)   SpO2 94%   BMI 28.35 kg/m    General: Well developed, well nourished,male appearing in no acute distress. Head: Normocephalic, atraumatic. Neck: No carotid bruits. JVD not elevated.  Lungs: Respirations regular and unlabored, without wheezes or rales.  Heart: Regular rate and rhythm. No  S3 or S4.  No murmur, no rubs, or gallops appreciated. Abdomen: Appears non-distended. No obvious abdominal masses. Msk:  Strength and tone appear normal for age. No obvious joint deformities or effusions. Extremities: No clubbing or cyanosis. No lower extremity edema.  Distal pedal pulses are 2+ bilaterally. Neuro: Alert and oriented X 3. Moves all extremities spontaneously. No focal deficits noted. Psych:  Responds to questions appropriately with a normal affect. Skin: Areas of mild erythema along arms and chest.   Wt Readings from Last 3 Encounters:  02/16/21 209 lb (94.8 kg)  07/21/20 203 lb (92.1 kg)  02/11/20 203 lb (92.1 kg)     Studies/Labs Reviewed:   EKG:  EKG is ordered today.  The ekg ordered today demonstrates NSR, HR 66 with 1st degree AV Block and incomplete RBBB. LAD.   Recent Labs: No results found for requested labs within last 8760 hours.   Lipid Panel    Component Value Date/Time   TRIG 34 11/17/2019 1057    Additional studies/ records that were reviewed today include:   NST: 05/2018  Defect 1: There is a medium defect of moderate severity present in the mid inferoseptal, mid inferior and apical inferior location.  Defect 2: There is a medium defect of moderate severity present in the mid anteroseptal location.  Findings in defect 1 are consistent with prior myocardial infarction with peri-infarct ischemia. Findings in defect to are consistent with ischemia  This is an intermediate risk study.  Nuclear stress EF: 70%.   Event Monitor: 12/2019  Sinus rhythm and sinus tachycardia seen, average heart rate 76 bpm. One isolated 4 beat run of NSVT was seen.   Assessment:    1. Coronary artery disease involving coronary bypass graft of native heart without angina pectoris   2. Palpitations   3. Essential hypertension   4. Hyperlipidemia LDL goal <70   5. Eczema, unspecified type      Plan:   In order of problems listed above:  1. CAD - He is  s/pCABG in 02/2009 and most recent ischemic evaluation was an intermediate-risk NST in 05/2018 with medical management recommended. - He is active at baseline and denies any recent anginal symptoms. - Continue ASA 81mg  daily and Crestor 20mg  daily. Previously intolerant to beta-blocker therapy.   2. Palpitations - He had episodes of sinus tachycardia and isolated NSVT by prior monitor in 12/2019. No recent symptoms. He does not consume caffeine regularly. Remains on Cardizem 30mg  BID.   3. HTN - BP is at 134/70 during today's visit. Continue Cardizem 30mg  BID  4. HLD - LDL was at 63 in 09/2020 with LFT's WNL. Remains on Crestor 20mg  daily.   5. Eczema - He does report areas of itching along his upper extremities and chest for the past few months. Mild patches of erythema noted with excoriations that appear to be from scratching. He has been using Hydrocortisone with improvement in his symptoms and was encouraged to continue to do so. The description of his symptoms and exam appear most consistent with eczema. I encouraged him to continue to use OTC Hydrocortisone and to let us or his PCP know if symptoms persist or worsen as he would benefit from referral to Dermatology.   Medication Adjustments/Labs and Tests Ordered: Current medicines are reviewed at length with the patient today.  Concerns regarding medicines are outlined above.  Medication changes, Labs and Tests ordered today are listed in the Patient Instructions below. Patient Instructions  Medication Instructions:  Your physician recommends that you continue on your current medications as directed. Please refer to the Current Medication list given to you today.  *If you need a refill on your cardiac medications before your next appointment, please call your pharmacy*   Lab Work: None today If you have labs (blood work) drawn today and your tests are completely normal, you will receive your results only by: Marland Kitchen MyChart Message (if  you have MyChart) OR . A paper copy in the mail If you have any lab test that is abnormal or we need to change your treatment, we will call you to review the results.   Testing/Procedures: None today   Follow-Up: At [facility], you and your health needs are  our priority.  As part of our continuing mission to provide you with exceptional heart care, we have created designated Provider Care Teams.  These Care Teams include your primary Cardiologist (physician) and Advanced Practice Providers (APPs -  Physician Assistants and Nurse Practitioners) who all work together to provide you with the care you need, when you need it.  We recommend signing up for the patient portal called "MyChart".  Sign up information is provided on this After Visit Summary.  MyChart is used to connect with patients for Virtual Visits (Telemedicine).  Patients are able to view lab/test results, encounter notes, upcoming appointments, etc.  Non-urgent messages can be sent to your provider as well.   To learn more about what you can do with MyChart, go to [email or web address].    Your next appointment:   6 month(s)  The format for your next appointment:   In Person  Provider:   Bernerd Pho, PA-C   Other Instructions None        Thank you for choosing [facility] !            Signed, Erma Heritage, PA-C  02/16/2021 4:51 PM    [place] [address] [place], [place] [address] Phone: [phone] Fax: [phone]

## 2021-03-02 DIAGNOSIS — E7849 Other hyperlipidemia: Secondary | ICD-10-CM | POA: Diagnosis not present

## 2021-03-02 DIAGNOSIS — N400 Enlarged prostate without lower urinary tract symptoms: Secondary | ICD-10-CM | POA: Diagnosis not present

## 2021-03-02 DIAGNOSIS — I10 Essential (primary) hypertension: Secondary | ICD-10-CM | POA: Diagnosis not present

## 2021-03-02 DIAGNOSIS — E1165 Type 2 diabetes mellitus with hyperglycemia: Secondary | ICD-10-CM | POA: Diagnosis not present

## 2021-03-30 DIAGNOSIS — D649 Anemia, unspecified: Secondary | ICD-10-CM | POA: Diagnosis not present

## 2021-03-30 DIAGNOSIS — D529 Folate deficiency anemia, unspecified: Secondary | ICD-10-CM | POA: Diagnosis not present

## 2021-03-30 DIAGNOSIS — Z1329 Encounter for screening for other suspected endocrine disorder: Secondary | ICD-10-CM | POA: Diagnosis not present

## 2021-03-30 DIAGNOSIS — E782 Mixed hyperlipidemia: Secondary | ICD-10-CM | POA: Diagnosis not present

## 2021-03-30 DIAGNOSIS — E1165 Type 2 diabetes mellitus with hyperglycemia: Secondary | ICD-10-CM | POA: Diagnosis not present

## 2021-03-30 DIAGNOSIS — I10 Essential (primary) hypertension: Secondary | ICD-10-CM | POA: Diagnosis not present

## 2021-03-30 DIAGNOSIS — K219 Gastro-esophageal reflux disease without esophagitis: Secondary | ICD-10-CM | POA: Diagnosis not present

## 2021-03-30 DIAGNOSIS — D519 Vitamin B12 deficiency anemia, unspecified: Secondary | ICD-10-CM | POA: Diagnosis not present

## 2021-03-30 DIAGNOSIS — R5383 Other fatigue: Secondary | ICD-10-CM | POA: Diagnosis not present

## 2021-04-02 DIAGNOSIS — E7849 Other hyperlipidemia: Secondary | ICD-10-CM | POA: Diagnosis not present

## 2021-04-02 DIAGNOSIS — I10 Essential (primary) hypertension: Secondary | ICD-10-CM | POA: Diagnosis not present

## 2021-04-02 DIAGNOSIS — N400 Enlarged prostate without lower urinary tract symptoms: Secondary | ICD-10-CM | POA: Diagnosis not present

## 2021-04-02 DIAGNOSIS — E1165 Type 2 diabetes mellitus with hyperglycemia: Secondary | ICD-10-CM | POA: Diagnosis not present

## 2021-04-06 DIAGNOSIS — K429 Umbilical hernia without obstruction or gangrene: Secondary | ICD-10-CM | POA: Diagnosis not present

## 2021-04-06 DIAGNOSIS — I10 Essential (primary) hypertension: Secondary | ICD-10-CM | POA: Diagnosis not present

## 2021-04-06 DIAGNOSIS — J301 Allergic rhinitis due to pollen: Secondary | ICD-10-CM | POA: Diagnosis not present

## 2021-04-06 DIAGNOSIS — N400 Enlarged prostate without lower urinary tract symptoms: Secondary | ICD-10-CM | POA: Diagnosis not present

## 2021-04-06 DIAGNOSIS — R4582 Worries: Secondary | ICD-10-CM | POA: Diagnosis not present

## 2021-04-06 DIAGNOSIS — E7849 Other hyperlipidemia: Secondary | ICD-10-CM | POA: Diagnosis not present

## 2021-04-06 DIAGNOSIS — M1711 Unilateral primary osteoarthritis, right knee: Secondary | ICD-10-CM | POA: Diagnosis not present

## 2021-04-06 DIAGNOSIS — I25111 Atherosclerotic heart disease of native coronary artery with angina pectoris with documented spasm: Secondary | ICD-10-CM | POA: Diagnosis not present

## 2021-05-02 DIAGNOSIS — N400 Enlarged prostate without lower urinary tract symptoms: Secondary | ICD-10-CM | POA: Diagnosis not present

## 2021-05-02 DIAGNOSIS — I10 Essential (primary) hypertension: Secondary | ICD-10-CM | POA: Diagnosis not present

## 2021-05-02 DIAGNOSIS — E7849 Other hyperlipidemia: Secondary | ICD-10-CM | POA: Diagnosis not present

## 2021-05-02 DIAGNOSIS — E1165 Type 2 diabetes mellitus with hyperglycemia: Secondary | ICD-10-CM | POA: Diagnosis not present

## 2021-06-02 DIAGNOSIS — I10 Essential (primary) hypertension: Secondary | ICD-10-CM | POA: Diagnosis not present

## 2021-06-02 DIAGNOSIS — N400 Enlarged prostate without lower urinary tract symptoms: Secondary | ICD-10-CM | POA: Diagnosis not present

## 2021-06-02 DIAGNOSIS — E1165 Type 2 diabetes mellitus with hyperglycemia: Secondary | ICD-10-CM | POA: Diagnosis not present

## 2021-06-02 DIAGNOSIS — E7849 Other hyperlipidemia: Secondary | ICD-10-CM | POA: Diagnosis not present

## 2021-07-03 DIAGNOSIS — E1165 Type 2 diabetes mellitus with hyperglycemia: Secondary | ICD-10-CM | POA: Diagnosis not present

## 2021-07-03 DIAGNOSIS — E7849 Other hyperlipidemia: Secondary | ICD-10-CM | POA: Diagnosis not present

## 2021-07-03 DIAGNOSIS — I10 Essential (primary) hypertension: Secondary | ICD-10-CM | POA: Diagnosis not present

## 2021-07-03 DIAGNOSIS — N400 Enlarged prostate without lower urinary tract symptoms: Secondary | ICD-10-CM | POA: Diagnosis not present

## 2021-09-02 DIAGNOSIS — E7849 Other hyperlipidemia: Secondary | ICD-10-CM | POA: Diagnosis not present

## 2021-09-02 DIAGNOSIS — E1165 Type 2 diabetes mellitus with hyperglycemia: Secondary | ICD-10-CM | POA: Diagnosis not present

## 2021-09-02 DIAGNOSIS — N400 Enlarged prostate without lower urinary tract symptoms: Secondary | ICD-10-CM | POA: Diagnosis not present

## 2021-09-02 DIAGNOSIS — I10 Essential (primary) hypertension: Secondary | ICD-10-CM | POA: Diagnosis not present

## 2021-09-07 ENCOUNTER — Other Ambulatory Visit: Payer: Self-pay

## 2021-09-07 ENCOUNTER — Ambulatory Visit (INDEPENDENT_AMBULATORY_CARE_PROVIDER_SITE_OTHER): Payer: Medicare Other | Admitting: Cardiology

## 2021-09-07 ENCOUNTER — Encounter: Payer: Self-pay | Admitting: Cardiology

## 2021-09-07 ENCOUNTER — Other Ambulatory Visit (HOSPITAL_COMMUNITY)
Admission: RE | Admit: 2021-09-07 | Discharge: 2021-09-07 | Disposition: A | Discharge status: Home / Self Care | Payer: Medicare Other | Source: Ambulatory Visit | Attending: Cardiology | Admitting: Cardiology

## 2021-09-07 VITALS — BP 120/70 | HR 82 | Ht 72.0 in | Wt 209.4 lb

## 2021-09-07 DIAGNOSIS — E785 Hyperlipidemia, unspecified: Secondary | ICD-10-CM | POA: Diagnosis not present

## 2021-09-07 DIAGNOSIS — I10 Essential (primary) hypertension: Secondary | ICD-10-CM

## 2021-09-07 DIAGNOSIS — R002 Palpitations: Secondary | ICD-10-CM

## 2021-09-07 DIAGNOSIS — I25810 Atherosclerosis of coronary artery bypass graft(s) without angina pectoris: Secondary | ICD-10-CM

## 2021-09-07 DIAGNOSIS — I2510 Atherosclerotic heart disease of native coronary artery without angina pectoris: Secondary | ICD-10-CM

## 2021-09-07 LAB — LIPID PANEL
Cholesterol: 108 mg/dL (ref 0–200)
HDL: 41 mg/dL (ref >40)
LDL Cholesterol: 60 mg/dL (ref 0–99)
Total CHOL/HDL Ratio: 2.6 RATIO
Triglycerides: 35 mg/dL (ref <150)
VLDL: 7 mg/dL (ref 0–40)

## 2021-09-07 LAB — ALT: ALT: 17 U/L (ref 0–44)

## 2021-09-07 MED ORDER — ROSUVASTATIN CALCIUM 20 MG PO TABS: 20.0000 mg | ORAL_TABLET | Freq: Every day | ORAL | 3 refills | Status: DC

## 2021-09-07 MED ORDER — DILTIAZEM HCL 30 MG PO TABS: 30.0000 mg | ORAL_TABLET | Freq: Two times a day (BID) | ORAL | 3 refills | Status: DC

## 2021-09-07 NOTE — Patient Instructions (Signed)
Medication Instructions:  Your physician recommends that you continue on your current medications as directed. Please refer to the Current Medication list given to you today.  *If you need a refill on your cardiac medications before your next appointment, please call your pharmacy*   Lab Work: TODAY: Fasting lipids and ALT If you have labs (blood work) drawn today and your tests are completely normal, you will receive your results only by: [place] (if you have MyChart) OR A paper copy in the mail If you have any lab test that is abnormal or we need to change your treatment, we will call you to review the results.  Follow-Up: At [facility], you and your health needs are our priority.  As part of our continuing mission to provide you with exceptional heart care, we have created designated Provider Care Teams.  These Care Teams include your primary Cardiologist (physician) and Advanced Practice Providers (APPs -  Physician Assistants and Nurse Practitioners) who all work together to provide you with the care you need, when you need it.   Your next appointment:   1 year(s)  The format for your next appointment:   In Person  Provider:   You may see Fransico Him, MD or one of the following Advanced Practice Providers on your designated Care Team:   [place], PA-C  Ermalinda Barrios, [place]

## 2021-09-07 NOTE — Addendum Note (Signed)
Addended by: Antonieta Iba on: 09/07/2021 08:32 AM   Modules accepted: Orders

## 2021-09-07 NOTE — Progress Notes (Signed)
Cardiology Office Note    Date:  09/07/2021   ID:  Ata, Pecha [birth year], MRN [id number]  PCP:  Caryl Bis, MD  Cardiologist: Kate Sable, MD (Inactive)  --> Needs to switch to new MD  Chief Complaint  Patient presents with   Coronary Artery Disease   Hypertension   Hyperlipidemia    History of Present Illness:    [name] [name] is a 80 y.o. male with past medical history of CAD (s/p CABG in 02/2009, intermediate-risk NST in 05/2018 with medical management recommended), palpitations (episodes of sinus tachycardia and isolated NSVT by prior monitor), HTN and HLD.   He is here today for followup and is doing well.  He denies any chest pain or pressure, SOB, DOE(except with extreme exertion), PND, orthopnea, LE edema, dizziness, palpitations or syncope. He is compliant with his meds and is tolerating meds with no SE.     Past Medical History:  Diagnosis Date   CAD (coronary artery disease) of artery bypass graft    a. s/p CABG in 02/2009 b. intermediate-risk NST in 05/2018 with medical management recommended   Hyperlipidemia LDL goal <70    OA (osteoarthritis) of knee     Past Surgical History:  Procedure Laterality Date   COLONOSCOPY N/A 08/03/2017   Procedure: COLONOSCOPY;  Surgeon: Rogene Houston, MD;  [facility];  Service: Endoscopy;  Laterality: N/A;  730   CORONARY ARTERY BYPASS GRAFT     FLEXIBLE SIGMOIDOSCOPY N/A 08/15/2017   Procedure: FLEXIBLE SIGMOIDOSCOPY;  Surgeon: Rogene Houston, MD;  [facility];  Service: Endoscopy;  Laterality: N/A;   JOINT REPLACEMENT Left 2012   Left knee   KNEE ARTHROSCOPY      Current Medications: Outpatient Medications Prior to Visit  Medication Sig Dispense Refill   aspirin 81 MG tablet Take 1 tablet (81 mg total) by mouth every other day. 30 tablet    Cholecalciferol (VITAMIN D) 2000 units CAPS Take 1 capsule by mouth daily.     diltiazem (CARDIZEM) 30 MG tablet Take 1 tablet (30 mg  total) by mouth 2 (two) times daily. 60 tablet 11   FERREX 150 150 MG capsule      fluticasone (FLONASE) 50 MCG/ACT nasal spray Place 1 spray into the nose at bedtime.      loratadine (CLARITIN) 10 MG tablet Take 10 mg by mouth daily.     Multiple Vitamin (MULTIVITAMIN) tablet Take 1 tablet by mouth daily.     nitroGLYCERIN (NITROSTAT) 0.4 MG SL tablet Place 1 tablet (0.4 mg total) under the tongue every 5 (five) minutes as needed. 25 tablet 3   Omega-3 Fatty Acids (FISH OIL) 600 MG CAPS Take 600 mg by mouth daily.     oxyCODONE-acetaminophen (PERCOCET/ROXICET) 5-325 MG tablet Take 1 tablet by mouth every 4 (four) hours as needed for severe pain.     pantoprazole (PROTONIX) 40 MG tablet Take 40 mg by mouth daily.     rosuvastatin (CRESTOR) 20 MG tablet Take 20 mg by mouth daily with lunch.      tamsulosin (FLOMAX) 0.4 MG CAPS capsule tamsulosin 0.4 mg capsule  TAKE ONE CAPSULE BY MOUTH AT BEDTIME     No facility-administered medications prior to visit.     Allergies:   Beta adrenergic blockers and Morphine   Social History   Socioeconomic History   Marital status: Married    Spouse name: Not on file   Number of children: Not on file  Years of education: Not on file   Highest education level: Not on file  Occupational History   Not on file  Tobacco Use   Smoking status: Never   Smokeless tobacco: Never  Vaping Use   Vaping Use: Never used  Substance and Sexual Activity   Alcohol use: No    Alcohol/week: 0.0 standard drinks   Drug use: No   Sexual activity: Not on file  Other Topics Concern   Not on file  Social History Narrative   Not on file   Social Determinants of Health   Financial Resource Strain: Not on file  Food Insecurity: Not on file  Transportation Needs: Not on file  Physical Activity: Not on file  Stress: Not on file  Social Connections: Not on file     Family History:  The patient's family history includes Coronary artery disease in his father,  mother, and another family member; Heart attack in his father.   Review of Systems:   Please see the history of present illness.     General:  No chills, fever, night sweats or weight changes.  Cardiovascular:  No chest pain, dyspnea on exertion, edema, orthopnea, palpitations, paroxysmal nocturnal dyspnea. Dermatological: No lesions/masses. Positive for itchy skin.  Respiratory: No cough, dyspnea Urologic: No hematuria. Positive for nocturia.  Abdominal:   No nausea, vomiting, diarrhea, bright red blood per rectum, melena, or hematemesis Neurologic:  No visual changes, wkns, changes in mental status. All other systems reviewed and are otherwise negative except as noted above.   Physical Exam:    VS:  BP 120/70   Pulse 82   Ht 6' (1.829 m)   Wt 209 lb 6.4 oz (95 kg)   SpO2 97%   BMI 28.40 kg/m    GEN: Well nourished, well developed in no acute distress HEENT: Normal NECK: No JVD; No carotid bruits LYMPHATICS: No lymphadenopathy CARDIAC:RRR, no murmurs, rubs, gallops RESPIRATORY:  Clear to auscultation without rales, wheezing or rhonchi  ABDOMEN: Soft, non-tender, non-distended MUSCULOSKELETAL:  No edema; No deformity  SKIN: Warm and dry NEUROLOGIC:  Alert and oriented x 3 PSYCHIATRIC:  Normal affect   Wt Readings from Last 3 Encounters:  09/07/21 209 lb 6.4 oz (95 kg)  02/16/21 209 lb (94.8 kg)  07/21/20 203 lb (92.1 kg)     Studies/Labs Reviewed:   EKG:  EKG is ordered today.    Recent Labs: No results found for requested labs within last 8760 hours.   Lipid Panel    Component Value Date/Time   TRIG 34 11/17/2019 1057    Additional studies/ records that were reviewed today include:   NST: 05/2018 Defect 1: There is a medium defect of moderate severity present in the mid inferoseptal, mid inferior and apical inferior location. Defect 2: There is a medium defect of moderate severity present in the mid anteroseptal location. Findings in defect 1 are  consistent with prior myocardial infarction with peri-infarct ischemia. Findings in defect to are consistent with ischemia This is an intermediate risk study. Nuclear stress EF: 70%.   Event Monitor: 12/2019 Sinus rhythm and sinus tachycardia seen, average heart rate 76 bpm. One isolated 4 beat run of NSVT was seen.   Assessment:    1. Coronary artery disease involving native coronary artery of native heart without angina pectoris   2. Palpitations   3. Essential hypertension   4. Hyperlipidemia LDL goal <70       Plan:   In order of problems listed above:  1. CAD - He is s/p CABG in 02/2009 and most recent ischemic evaluation was an intermediate-risk NST in 05/2018 with medical management recommended. - He has not had any further anginal symptoms since he saw the PA last -continue prescription drug management with ASA 81mg  daily and statin -Previously intolerant to beta-blocker therapy.   2. Palpitations - He had episodes of sinus tachycardia and isolated NSVT by prior monitor in 12/2019.  -he has not had any recent palpitations -continue prescription drug management with Cardizem 30mg  BID>refilled  3. HTN - BP is well controlled on exam today  -continue Prescription drug management with Cardizem 30mg  BID  4. HLD -LDL goal < 70 -check FLP and ALT -continue prescription drug management with Crestor 20mg  daily >refilled  Medication Adjustments/Labs and Tests Ordered: Current medicines are reviewed at length with the patient today.  Concerns regarding medicines are outlined above.  Medication changes, Labs and Tests ordered today are listed in the Patient Instructions below. There are no Patient Instructions on file for this visit.   Signed, Fransico Him, MD  09/07/2021 8:23 AM    [place] [address] [place], [place] [address] Phone: [phone] Fax: [phone]

## 2021-09-28 DIAGNOSIS — E1165 Type 2 diabetes mellitus with hyperglycemia: Secondary | ICD-10-CM | POA: Diagnosis not present

## 2021-09-28 DIAGNOSIS — I10 Essential (primary) hypertension: Secondary | ICD-10-CM | POA: Diagnosis not present

## 2021-09-28 DIAGNOSIS — Z20828 Contact with and (suspected) exposure to other viral communicable diseases: Secondary | ICD-10-CM | POA: Diagnosis not present

## 2021-09-28 DIAGNOSIS — D649 Anemia, unspecified: Secondary | ICD-10-CM | POA: Diagnosis not present

## 2021-09-28 DIAGNOSIS — E782 Mixed hyperlipidemia: Secondary | ICD-10-CM | POA: Diagnosis not present

## 2021-09-28 DIAGNOSIS — E7849 Other hyperlipidemia: Secondary | ICD-10-CM | POA: Diagnosis not present

## 2021-09-28 DIAGNOSIS — Z1329 Encounter for screening for other suspected endocrine disorder: Secondary | ICD-10-CM | POA: Diagnosis not present

## 2021-09-28 DIAGNOSIS — K219 Gastro-esophageal reflux disease without esophagitis: Secondary | ICD-10-CM | POA: Diagnosis not present

## 2021-09-28 DIAGNOSIS — D529 Folate deficiency anemia, unspecified: Secondary | ICD-10-CM | POA: Diagnosis not present

## 2021-09-28 DIAGNOSIS — D519 Vitamin B12 deficiency anemia, unspecified: Secondary | ICD-10-CM | POA: Diagnosis not present

## 2021-10-05 DIAGNOSIS — M1711 Unilateral primary osteoarthritis, right knee: Secondary | ICD-10-CM | POA: Diagnosis not present

## 2021-10-05 DIAGNOSIS — K429 Umbilical hernia without obstruction or gangrene: Secondary | ICD-10-CM | POA: Diagnosis not present

## 2021-10-05 DIAGNOSIS — I25119 Atherosclerotic heart disease of native coronary artery with unspecified angina pectoris: Secondary | ICD-10-CM | POA: Diagnosis not present

## 2021-10-05 DIAGNOSIS — I10 Essential (primary) hypertension: Secondary | ICD-10-CM | POA: Diagnosis not present

## 2021-10-05 DIAGNOSIS — Z0001 Encounter for general adult medical examination with abnormal findings: Secondary | ICD-10-CM | POA: Diagnosis not present

## 2021-10-05 DIAGNOSIS — E7849 Other hyperlipidemia: Secondary | ICD-10-CM | POA: Diagnosis not present

## 2021-10-05 DIAGNOSIS — R4582 Worries: Secondary | ICD-10-CM | POA: Diagnosis not present

## 2021-10-05 DIAGNOSIS — N400 Enlarged prostate without lower urinary tract symptoms: Secondary | ICD-10-CM | POA: Diagnosis not present

## 2021-10-31 DIAGNOSIS — Z20828 Contact with and (suspected) exposure to other viral communicable diseases: Secondary | ICD-10-CM | POA: Diagnosis not present

## 2021-11-30 DIAGNOSIS — Z20828 Contact with and (suspected) exposure to other viral communicable diseases: Secondary | ICD-10-CM | POA: Diagnosis not present

## 2022-02-08 DIAGNOSIS — H43811 Vitreous degeneration, right eye: Secondary | ICD-10-CM | POA: Diagnosis not present

## 2022-03-15 DIAGNOSIS — H43811 Vitreous degeneration, right eye: Secondary | ICD-10-CM | POA: Diagnosis not present

## 2022-03-15 DIAGNOSIS — H25813 Combined forms of age-related cataract, bilateral: Secondary | ICD-10-CM | POA: Diagnosis not present

## 2022-03-15 DIAGNOSIS — H35373 Puckering of macula, bilateral: Secondary | ICD-10-CM | POA: Diagnosis not present

## 2022-04-28 DIAGNOSIS — K2100 Gastro-esophageal reflux disease with esophagitis, without bleeding: Secondary | ICD-10-CM | POA: Diagnosis not present

## 2022-04-28 DIAGNOSIS — E1165 Type 2 diabetes mellitus with hyperglycemia: Secondary | ICD-10-CM | POA: Diagnosis not present

## 2022-04-28 DIAGNOSIS — R5383 Other fatigue: Secondary | ICD-10-CM | POA: Diagnosis not present

## 2022-04-28 DIAGNOSIS — I10 Essential (primary) hypertension: Secondary | ICD-10-CM | POA: Diagnosis not present

## 2022-04-28 DIAGNOSIS — E7849 Other hyperlipidemia: Secondary | ICD-10-CM | POA: Diagnosis not present

## 2022-04-28 DIAGNOSIS — D529 Folate deficiency anemia, unspecified: Secondary | ICD-10-CM | POA: Diagnosis not present

## 2022-04-28 DIAGNOSIS — D649 Anemia, unspecified: Secondary | ICD-10-CM | POA: Diagnosis not present

## 2022-04-28 DIAGNOSIS — E782 Mixed hyperlipidemia: Secondary | ICD-10-CM | POA: Diagnosis not present

## 2022-04-28 DIAGNOSIS — D519 Vitamin B12 deficiency anemia, unspecified: Secondary | ICD-10-CM | POA: Diagnosis not present

## 2022-05-02 DIAGNOSIS — E7849 Other hyperlipidemia: Secondary | ICD-10-CM | POA: Diagnosis not present

## 2022-05-02 DIAGNOSIS — R4582 Worries: Secondary | ICD-10-CM | POA: Diagnosis not present

## 2022-05-02 DIAGNOSIS — I10 Essential (primary) hypertension: Secondary | ICD-10-CM | POA: Diagnosis not present

## 2022-05-02 DIAGNOSIS — N400 Enlarged prostate without lower urinary tract symptoms: Secondary | ICD-10-CM | POA: Diagnosis not present

## 2022-05-02 DIAGNOSIS — Z79891 Long term (current) use of opiate analgesic: Secondary | ICD-10-CM | POA: Diagnosis not present

## 2022-05-02 DIAGNOSIS — D649 Anemia, unspecified: Secondary | ICD-10-CM | POA: Diagnosis not present

## 2022-05-02 DIAGNOSIS — K429 Umbilical hernia without obstruction or gangrene: Secondary | ICD-10-CM | POA: Diagnosis not present

## 2022-05-02 DIAGNOSIS — Z23 Encounter for immunization: Secondary | ICD-10-CM | POA: Diagnosis not present

## 2022-05-02 DIAGNOSIS — I25119 Atherosclerotic heart disease of native coronary artery with unspecified angina pectoris: Secondary | ICD-10-CM | POA: Diagnosis not present

## 2022-06-07 DIAGNOSIS — H524 Presbyopia: Secondary | ICD-10-CM | POA: Diagnosis not present

## 2022-06-07 DIAGNOSIS — H2513 Age-related nuclear cataract, bilateral: Secondary | ICD-10-CM | POA: Diagnosis not present

## 2022-06-07 DIAGNOSIS — H52223 Regular astigmatism, bilateral: Secondary | ICD-10-CM | POA: Diagnosis not present

## 2022-06-07 DIAGNOSIS — H5203 Hypermetropia, bilateral: Secondary | ICD-10-CM | POA: Diagnosis not present

## 2022-06-07 DIAGNOSIS — D3132 Benign neoplasm of left choroid: Secondary | ICD-10-CM | POA: Diagnosis not present

## 2022-07-28 ENCOUNTER — Encounter (INDEPENDENT_AMBULATORY_CARE_PROVIDER_SITE_OTHER): Payer: Self-pay | Admitting: *Deleted

## 2022-07-28 DIAGNOSIS — R030 Elevated blood-pressure reading, without diagnosis of hypertension: Secondary | ICD-10-CM | POA: Diagnosis not present

## 2022-07-28 DIAGNOSIS — Z6827 Body mass index (BMI) 27.0-27.9, adult: Secondary | ICD-10-CM | POA: Diagnosis not present

## 2022-07-28 DIAGNOSIS — M6740 Ganglion, unspecified site: Secondary | ICD-10-CM | POA: Diagnosis not present

## 2022-08-09 DIAGNOSIS — F17210 Nicotine dependence, cigarettes, uncomplicated: Secondary | ICD-10-CM | POA: Diagnosis not present

## 2022-08-09 DIAGNOSIS — Z23 Encounter for immunization: Secondary | ICD-10-CM | POA: Diagnosis not present

## 2022-09-13 ENCOUNTER — Other Ambulatory Visit: Payer: Self-pay | Admitting: Cardiology

## 2022-10-25 DIAGNOSIS — E7849 Other hyperlipidemia: Secondary | ICD-10-CM | POA: Diagnosis not present

## 2022-10-25 DIAGNOSIS — I10 Essential (primary) hypertension: Secondary | ICD-10-CM | POA: Diagnosis not present

## 2022-10-25 DIAGNOSIS — E1165 Type 2 diabetes mellitus with hyperglycemia: Secondary | ICD-10-CM | POA: Diagnosis not present

## 2022-10-25 DIAGNOSIS — K219 Gastro-esophageal reflux disease without esophagitis: Secondary | ICD-10-CM | POA: Diagnosis not present

## 2022-11-01 DIAGNOSIS — Z23 Encounter for immunization: Secondary | ICD-10-CM | POA: Diagnosis not present

## 2022-11-01 DIAGNOSIS — J301 Allergic rhinitis due to pollen: Secondary | ICD-10-CM | POA: Diagnosis not present

## 2022-11-01 DIAGNOSIS — K429 Umbilical hernia without obstruction or gangrene: Secondary | ICD-10-CM | POA: Diagnosis not present

## 2022-11-01 DIAGNOSIS — I10 Essential (primary) hypertension: Secondary | ICD-10-CM | POA: Diagnosis not present

## 2022-11-01 DIAGNOSIS — M1711 Unilateral primary osteoarthritis, right knee: Secondary | ICD-10-CM | POA: Diagnosis not present

## 2022-11-01 DIAGNOSIS — I25119 Atherosclerotic heart disease of native coronary artery with unspecified angina pectoris: Secondary | ICD-10-CM | POA: Diagnosis not present

## 2022-11-01 DIAGNOSIS — K2100 Gastro-esophageal reflux disease with esophagitis, without bleeding: Secondary | ICD-10-CM | POA: Diagnosis not present

## 2022-11-01 DIAGNOSIS — D649 Anemia, unspecified: Secondary | ICD-10-CM | POA: Diagnosis not present

## 2022-11-01 DIAGNOSIS — N400 Enlarged prostate without lower urinary tract symptoms: Secondary | ICD-10-CM | POA: Diagnosis not present

## 2022-11-01 DIAGNOSIS — E7849 Other hyperlipidemia: Secondary | ICD-10-CM | POA: Diagnosis not present

## 2022-11-01 DIAGNOSIS — R4582 Worries: Secondary | ICD-10-CM | POA: Diagnosis not present

## 2022-11-01 DIAGNOSIS — Z79891 Long term (current) use of opiate analgesic: Secondary | ICD-10-CM | POA: Diagnosis not present

## 2022-11-03 ENCOUNTER — Encounter: Payer: Self-pay | Admitting: Internal Medicine

## 2022-11-03 ENCOUNTER — Ambulatory Visit: Payer: Medicare Other | Attending: Internal Medicine | Admitting: Internal Medicine

## 2022-11-03 VITALS — BP 142/68 | HR 67 | Ht 72.0 in | Wt 200.6 lb

## 2022-11-03 DIAGNOSIS — I351 Nonrheumatic aortic (valve) insufficiency: Secondary | ICD-10-CM | POA: Insufficient documentation

## 2022-11-03 DIAGNOSIS — I2510 Atherosclerotic heart disease of native coronary artery without angina pectoris: Secondary | ICD-10-CM

## 2022-11-03 DIAGNOSIS — I10 Essential (primary) hypertension: Secondary | ICD-10-CM | POA: Diagnosis not present

## 2022-11-03 MED ORDER — NITROGLYCERIN 0.4 MG SL SUBL: 0.4000 mg | SUBLINGUAL_TABLET | SUBLINGUAL | 3 refills | Status: AC | PRN

## 2022-11-03 NOTE — Progress Notes (Unsigned)
Cardiology Office Note  Date: 11/03/2022   ID: [name] [name], [name] [name] Nov 03, 1941, MRN [id number]  PCP:  Caryl Bis, MD  Cardiologist:  Kate Sable, MD (Inactive) Electrophysiologist:  None   Reason for Office Visit: No chief complaint on file.   History of Present Illness: [name] [name] is a 81 y.o. male  Past Medical History:  Diagnosis Date   CAD (coronary artery disease) of artery bypass graft    a. s/p CABG in 02/2009 b. intermediate-risk NST in 05/2018 with medical management recommended   Hyperlipidemia LDL goal <70    OA (osteoarthritis) of knee     Past Surgical History:  Procedure Laterality Date   COLONOSCOPY N/A 08/03/2017   Procedure: COLONOSCOPY;  Surgeon: Rogene Houston, MD;  Location: [facility];  Service: Endoscopy;  Laterality: N/A;  730   CORONARY ARTERY BYPASS GRAFT     FLEXIBLE SIGMOIDOSCOPY N/A 08/15/2017   Procedure: FLEXIBLE SIGMOIDOSCOPY;  Surgeon: Rogene Houston, MD;  Location: [facility];  Service: Endoscopy;  Laterality: N/A;   JOINT REPLACEMENT Left 2012   Left knee   KNEE ARTHROSCOPY      Current Outpatient Medications  Medication Sig Dispense Refill   aspirin 81 MG tablet Take 1 tablet (81 mg total) by mouth every other day. 30 tablet    Cholecalciferol (VITAMIN D) 2000 units CAPS Take 1 capsule by mouth daily.     diltiazem (CARDIZEM) 30 MG tablet TAKE 1 TABLET BY MOUTH TWICE DAILY 180 tablet 0   FERREX 150 150 MG capsule      fluticasone (FLONASE) 50 MCG/ACT nasal spray Place 1 spray into the nose at bedtime.      loratadine (CLARITIN) 10 MG tablet Take 10 mg by mouth daily.     Multiple Vitamin (MULTIVITAMIN) tablet Take 1 tablet by mouth daily.     Omega-3 Fatty Acids (FISH OIL) 600 MG CAPS Take 600 mg by mouth daily.     oxyCODONE-acetaminophen (PERCOCET/ROXICET) 5-325 MG tablet Take 1 tablet by mouth every 4 (four) hours as needed for severe pain.     pantoprazole (PROTONIX) 40 MG tablet Take 40 mg by mouth daily.      rosuvastatin (CRESTOR) 20 MG tablet TAKE 1 TABLET BY MOUTH EVERY DAY WITH LUNCH 90 tablet 0   tamsulosin (FLOMAX) 0.4 MG CAPS capsule tamsulosin 0.4 mg capsule  TAKE ONE CAPSULE BY MOUTH AT BEDTIME     nitroGLYCERIN (NITROSTAT) 0.4 MG SL tablet Place 1 tablet (0.4 mg total) under the tongue every 5 (five) minutes as needed. 25 tablet 3   No current facility-administered medications for this visit.   Allergies:  Beta adrenergic blockers and Morphine   Social History: The patient  reports that he has never smoked. He has never used smokeless tobacco. He reports that he does not drink alcohol and does not use drugs.   Family History: The patient's family history includes Coronary artery disease in his father, mother, and another family member; Heart attack in his father.   ROS:  Please see the history of present illness. Otherwise, complete review of systems is positive for {NONE DEFAULTED:18576}.  All other systems are reviewed and negative.   Physical Exam: VS:  BP (!) 142/68   Pulse 67   Ht 6' (1.829 m)   Wt 200 lb 9.6 oz (91 kg)   SpO2 95%   BMI 27.21 kg/m , BMI Body mass index is 27.21 kg/m.  Wt Readings from Last 3 Encounters:  11/03/22 200 lb 9.6 oz (91 kg)  09/07/21 209 lb 6.4 oz (95 kg)  02/16/21 209 lb (94.8 kg)    General: Patient appears comfortable at rest. HEENT: Conjunctiva and lids normal, oropharynx clear with moist mucosa. Neck: Supple, no elevated JVP or carotid bruits, no thyromegaly. Lungs: Clear to auscultation, nonlabored breathing at rest. Cardiac: Regular rate and rhythm, no S3 or significant systolic murmur, no pericardial rub. Abdomen: Soft, nontender, no hepatomegaly, bowel sounds present, no guarding or rebound. Extremities: No pitting edema, distal pulses 2+. Skin: Warm and dry. Musculoskeletal: No kyphosis. Neuropsychiatric: Alert and oriented x3, affect grossly appropriate.  ECG:  {EKG/Telemetry Strips Reviewed:[phone]}  Recent  Labwork: No results found for requested labs within last 365 days.     Component Value Date/Time   CHOL 108 09/07/2021 0848   TRIG 35 09/07/2021 0848   HDL 41 09/07/2021 0848   CHOLHDL 2.6 09/07/2021 0848   VLDL 7 09/07/2021 0848   LDLCALC 60 09/07/2021 0848    Other Studies Reviewed Today:   Assessment and Plan:   Medication Adjustments/Labs and Tests Ordered: Current medicines are reviewed at length with the patient today.  Concerns regarding medicines are outlined above.   Tests Ordered: Orders Placed This Encounter  Procedures   EKG 12-Lead   ECHOCARDIOGRAM COMPLETE    Medication Changes: Meds ordered this encounter  Medications   nitroGLYCERIN (NITROSTAT) 0.4 MG SL tablet    Sig: Place 1 tablet (0.4 mg total) under the tongue every 5 (five) minutes as needed.    Dispense:  25 tablet    Refill:  3    Disposition:  Follow up {follow up:15908}  Signed, Jerzee Jerome Fidel Levy, MD, 11/03/2022 1:03 PM    [facility] [place] [address] [place], [place] [address]

## 2022-11-03 NOTE — Patient Instructions (Signed)
Medication Instructions:  Your physician recommends that you continue on your current medications as directed. Please refer to the Current Medication list given to you today.   Labwork: None  Testing/Procedures: Your physician has requested that you have an echocardiogram. Echocardiography is a painless test that uses sound waves to create images of your heart. It provides your doctor with information about the size and shape of your heart and how well your heart's chambers and valves are working. This procedure takes approximately one hour. There are no restrictions for this procedure. Please do NOT wear cologne, perfume, aftershave, or lotions (deodorant is allowed). Please arrive 15 minutes prior to your appointment time.   Follow-Up: Follow up with Dr. Dellia Cloud in 1 year.   Any Other Special Instructions Will Be Listed Below (If Applicable).     If you need a refill on your cardiac medications before your next appointment, please call your pharmacy.

## 2022-11-04 DIAGNOSIS — I10 Essential (primary) hypertension: Secondary | ICD-10-CM | POA: Insufficient documentation

## 2022-11-04 DIAGNOSIS — I351 Nonrheumatic aortic (valve) insufficiency: Secondary | ICD-10-CM | POA: Insufficient documentation

## 2022-11-15 ENCOUNTER — Telehealth: Payer: Self-pay | Admitting: Internal Medicine

## 2022-11-15 ENCOUNTER — Ambulatory Visit (HOSPITAL_COMMUNITY)
Admission: RE | Admit: 2022-11-15 | Discharge: 2022-11-15 | Disposition: A | Discharge status: Home / Self Care | Payer: Medicare Other | Source: Ambulatory Visit | Attending: Internal Medicine | Admitting: Internal Medicine

## 2022-11-15 DIAGNOSIS — I351 Nonrheumatic aortic (valve) insufficiency: Secondary | ICD-10-CM

## 2022-11-15 LAB — ECHOCARDIOGRAM COMPLETE
Area-P 1/2: 2.73 cm2
S' Lateral: 2.8 cm

## 2022-11-15 NOTE — Telephone Encounter (Signed)
Per providers last note:  # HTN, controlled -Continue Cardizem 30 mg twice daily  Patient was seen for Echo today at [facility]- per tech pt's bp was 175/85.   Please advise

## 2022-11-15 NOTE — Telephone Encounter (Signed)
Pt returning call. Please advise. °

## 2022-11-15 NOTE — Telephone Encounter (Signed)
Spoke to patient who verbalized understanding. Patient had no further questions at this time.

## 2022-11-15 NOTE — Telephone Encounter (Signed)
Left a message for patient to call office back .  

## 2022-11-15 NOTE — Progress Notes (Signed)
*  PRELIMINARY RESULTS* Echocardiogram 2D Echocardiogram has been performed.  [name] [name] 11/15/2022, 9:15 AM

## 2022-11-15 NOTE — Telephone Encounter (Signed)
Pt came in office and stated that when he went for his Echo this morning his blood pressure was elevated. It was noted that it was elevated at his last appt here. The pt was told to speak to someone and see if he is on a BP med or if he should be on one.

## 2022-12-11 ENCOUNTER — Other Ambulatory Visit: Payer: Self-pay | Admitting: Cardiology

## 2023-03-19 DIAGNOSIS — R300 Dysuria: Secondary | ICD-10-CM | POA: Diagnosis not present

## 2023-05-02 DIAGNOSIS — Z1329 Encounter for screening for other suspected endocrine disorder: Secondary | ICD-10-CM | POA: Diagnosis not present

## 2023-05-02 DIAGNOSIS — D649 Anemia, unspecified: Secondary | ICD-10-CM | POA: Diagnosis not present

## 2023-05-02 DIAGNOSIS — E559 Vitamin D deficiency, unspecified: Secondary | ICD-10-CM | POA: Diagnosis not present

## 2023-05-02 DIAGNOSIS — D519 Vitamin B12 deficiency anemia, unspecified: Secondary | ICD-10-CM | POA: Diagnosis not present

## 2023-05-02 DIAGNOSIS — E1165 Type 2 diabetes mellitus with hyperglycemia: Secondary | ICD-10-CM | POA: Diagnosis not present

## 2023-05-02 DIAGNOSIS — N400 Enlarged prostate without lower urinary tract symptoms: Secondary | ICD-10-CM | POA: Diagnosis not present

## 2023-05-02 DIAGNOSIS — E7849 Other hyperlipidemia: Secondary | ICD-10-CM | POA: Diagnosis not present

## 2023-05-09 DIAGNOSIS — M1711 Unilateral primary osteoarthritis, right knee: Secondary | ICD-10-CM | POA: Diagnosis not present

## 2023-05-09 DIAGNOSIS — E7849 Other hyperlipidemia: Secondary | ICD-10-CM | POA: Diagnosis not present

## 2023-05-09 DIAGNOSIS — N400 Enlarged prostate without lower urinary tract symptoms: Secondary | ICD-10-CM | POA: Diagnosis not present

## 2023-05-09 DIAGNOSIS — Z79891 Long term (current) use of opiate analgesic: Secondary | ICD-10-CM | POA: Diagnosis not present

## 2023-05-09 DIAGNOSIS — K2100 Gastro-esophageal reflux disease with esophagitis, without bleeding: Secondary | ICD-10-CM | POA: Diagnosis not present

## 2023-05-09 DIAGNOSIS — R4582 Worries: Secondary | ICD-10-CM | POA: Diagnosis not present

## 2023-05-09 DIAGNOSIS — K429 Umbilical hernia without obstruction or gangrene: Secondary | ICD-10-CM | POA: Diagnosis not present

## 2023-05-09 DIAGNOSIS — J301 Allergic rhinitis due to pollen: Secondary | ICD-10-CM | POA: Diagnosis not present

## 2023-05-09 DIAGNOSIS — I25119 Atherosclerotic heart disease of native coronary artery with unspecified angina pectoris: Secondary | ICD-10-CM | POA: Diagnosis not present

## 2023-05-09 DIAGNOSIS — I10 Essential (primary) hypertension: Secondary | ICD-10-CM | POA: Diagnosis not present

## 2023-05-09 DIAGNOSIS — D649 Anemia, unspecified: Secondary | ICD-10-CM | POA: Diagnosis not present

## 2023-05-09 DIAGNOSIS — Z6825 Body mass index (BMI) 25.0-25.9, adult: Secondary | ICD-10-CM | POA: Diagnosis not present

## 2023-08-15 DIAGNOSIS — N401 Enlarged prostate with lower urinary tract symptoms: Secondary | ICD-10-CM | POA: Diagnosis not present

## 2023-08-15 DIAGNOSIS — R351 Nocturia: Secondary | ICD-10-CM | POA: Diagnosis not present

## 2023-08-24 DIAGNOSIS — L570 Actinic keratosis: Secondary | ICD-10-CM | POA: Diagnosis not present

## 2023-09-05 ENCOUNTER — Other Ambulatory Visit: Payer: Self-pay | Admitting: Internal Medicine

## 2023-09-05 DIAGNOSIS — E7849 Other hyperlipidemia: Secondary | ICD-10-CM | POA: Diagnosis not present

## 2023-09-05 DIAGNOSIS — K219 Gastro-esophageal reflux disease without esophagitis: Secondary | ICD-10-CM | POA: Diagnosis not present

## 2023-09-05 DIAGNOSIS — D649 Anemia, unspecified: Secondary | ICD-10-CM | POA: Diagnosis not present

## 2023-09-05 DIAGNOSIS — I10 Essential (primary) hypertension: Secondary | ICD-10-CM | POA: Diagnosis not present

## 2023-09-05 DIAGNOSIS — E1165 Type 2 diabetes mellitus with hyperglycemia: Secondary | ICD-10-CM | POA: Diagnosis not present

## 2023-09-12 DIAGNOSIS — I10 Essential (primary) hypertension: Secondary | ICD-10-CM | POA: Diagnosis not present

## 2023-09-12 DIAGNOSIS — K2100 Gastro-esophageal reflux disease with esophagitis, without bleeding: Secondary | ICD-10-CM | POA: Diagnosis not present

## 2023-09-12 DIAGNOSIS — D649 Anemia, unspecified: Secondary | ICD-10-CM | POA: Diagnosis not present

## 2023-09-12 DIAGNOSIS — Z6826 Body mass index (BMI) 26.0-26.9, adult: Secondary | ICD-10-CM | POA: Diagnosis not present

## 2023-09-12 DIAGNOSIS — E7849 Other hyperlipidemia: Secondary | ICD-10-CM | POA: Diagnosis not present

## 2023-09-12 DIAGNOSIS — R4582 Worries: Secondary | ICD-10-CM | POA: Diagnosis not present

## 2023-09-12 DIAGNOSIS — K429 Umbilical hernia without obstruction or gangrene: Secondary | ICD-10-CM | POA: Diagnosis not present

## 2023-09-12 DIAGNOSIS — N400 Enlarged prostate without lower urinary tract symptoms: Secondary | ICD-10-CM | POA: Diagnosis not present

## 2023-09-12 DIAGNOSIS — I25119 Atherosclerotic heart disease of native coronary artery with unspecified angina pectoris: Secondary | ICD-10-CM | POA: Diagnosis not present

## 2023-09-12 DIAGNOSIS — Z79891 Long term (current) use of opiate analgesic: Secondary | ICD-10-CM | POA: Diagnosis not present

## 2023-09-12 DIAGNOSIS — K4090 Unilateral inguinal hernia, without obstruction or gangrene, not specified as recurrent: Secondary | ICD-10-CM | POA: Diagnosis not present

## 2023-09-12 DIAGNOSIS — M1711 Unilateral primary osteoarthritis, right knee: Secondary | ICD-10-CM | POA: Diagnosis not present

## 2023-09-26 DIAGNOSIS — N401 Enlarged prostate with lower urinary tract symptoms: Secondary | ICD-10-CM | POA: Diagnosis not present

## 2023-09-26 DIAGNOSIS — R351 Nocturia: Secondary | ICD-10-CM | POA: Diagnosis not present

## 2023-09-27 DIAGNOSIS — L0100 Impetigo, unspecified: Secondary | ICD-10-CM | POA: Diagnosis not present

## 2023-09-27 DIAGNOSIS — L0101 Non-bullous impetigo: Secondary | ICD-10-CM | POA: Diagnosis not present

## 2023-09-27 DIAGNOSIS — D485 Neoplasm of uncertain behavior of skin: Secondary | ICD-10-CM | POA: Diagnosis not present

## 2023-09-27 DIAGNOSIS — C44229 Squamous cell carcinoma of skin of left ear and external auricular canal: Secondary | ICD-10-CM | POA: Diagnosis not present

## 2023-09-27 DIAGNOSIS — L98499 Non-pressure chronic ulcer of skin of other sites with unspecified severity: Secondary | ICD-10-CM | POA: Diagnosis not present

## 2023-10-01 DIAGNOSIS — D3132 Benign neoplasm of left choroid: Secondary | ICD-10-CM | POA: Diagnosis not present

## 2023-10-01 DIAGNOSIS — H43813 Vitreous degeneration, bilateral: Secondary | ICD-10-CM | POA: Diagnosis not present

## 2023-10-01 DIAGNOSIS — Q141 Congenital malformation of retina: Secondary | ICD-10-CM | POA: Diagnosis not present

## 2023-10-01 DIAGNOSIS — H5203 Hypermetropia, bilateral: Secondary | ICD-10-CM | POA: Diagnosis not present

## 2023-10-01 DIAGNOSIS — H25813 Combined forms of age-related cataract, bilateral: Secondary | ICD-10-CM | POA: Diagnosis not present

## 2023-10-01 DIAGNOSIS — H52223 Regular astigmatism, bilateral: Secondary | ICD-10-CM | POA: Diagnosis not present

## 2023-10-01 DIAGNOSIS — H524 Presbyopia: Secondary | ICD-10-CM | POA: Diagnosis not present

## 2023-10-17 ENCOUNTER — Other Ambulatory Visit (HOSPITAL_COMMUNITY)
Admission: RE | Admit: 2023-10-17 | Discharge: 2023-10-17 | Disposition: A | Discharge status: Home / Self Care | Payer: Medicare Other | Source: Ambulatory Visit | Attending: Student | Admitting: Student

## 2023-10-17 ENCOUNTER — Encounter: Payer: Self-pay | Admitting: Student

## 2023-10-17 ENCOUNTER — Ambulatory Visit (INDEPENDENT_AMBULATORY_CARE_PROVIDER_SITE_OTHER): Payer: Medicare Other | Admitting: Student

## 2023-10-17 VITALS — BP 142/78 | HR 66 | Ht 72.0 in | Wt 198.0 lb

## 2023-10-17 DIAGNOSIS — E785 Hyperlipidemia, unspecified: Secondary | ICD-10-CM | POA: Diagnosis not present

## 2023-10-17 DIAGNOSIS — R002 Palpitations: Secondary | ICD-10-CM | POA: Diagnosis not present

## 2023-10-17 DIAGNOSIS — R3911 Hesitancy of micturition: Secondary | ICD-10-CM | POA: Insufficient documentation

## 2023-10-17 DIAGNOSIS — I2510 Atherosclerotic heart disease of native coronary artery without angina pectoris: Secondary | ICD-10-CM | POA: Diagnosis not present

## 2023-10-17 DIAGNOSIS — I10 Essential (primary) hypertension: Secondary | ICD-10-CM | POA: Diagnosis not present

## 2023-10-17 DIAGNOSIS — Z125 Encounter for screening for malignant neoplasm of prostate: Secondary | ICD-10-CM

## 2023-10-17 DIAGNOSIS — I77810 Thoracic aortic ectasia: Secondary | ICD-10-CM | POA: Insufficient documentation

## 2023-10-17 LAB — BASIC METABOLIC PANEL
Anion gap: 9 (ref 5–15)
BUN: 12 mg/dL (ref 8–23)
CO2: 23 mmol/L (ref 22–32)
Calcium: 9 mg/dL (ref 8.9–10.3)
Chloride: 102 mmol/L (ref 98–111)
Creatinine, Ser: 0.77 mg/dL (ref 0.61–1.24)
GFR, Estimated: >60 mL/min (ref >60)
Glucose, Bld: 120 mg/dL — ABNORMAL HIGH (ref 70–99)
Potassium: 3.9 mmol/L (ref 3.5–5.1)
Sodium: 134 mmol/L — ABNORMAL LOW (ref 135–145)

## 2023-10-17 LAB — PSA: Prostatic Specific Antigen: 1.13 ng/mL (ref 0.00–4.00)

## 2023-10-17 NOTE — Progress Notes (Signed)
Cardiology Office Note    Date:  10/17/2023  ID:  [name], [name] [birth year], MRN [id number] Cardiologist: Marjo Bicker, MD    History of Present Illness:    [name] [name] is a 82 y.o. male with past medical history of CAD (s/p CABG in 02/2009, intermediate-risk NST in 05/2018 with medical management recommended), palpitations (episodes of sinus tachycardia and isolated NSVT by prior monitor), HTN and HLD who presents to the office today for annual follow-up.  He was last examined by Dr. Jenene [name] in 11/2022 and denied any anginal symptoms at that time. He was continued on his current medical therapy with ASA 81 mg daily, short-acting Cardizem 30 mg twice daily and Crestor 20 mg daily. A follow-up echocardiogram was recommended for reassessment of his aortic valve regurgitation. Follow-up echocardiogram showed a preserved EF of 60 to 65% with no regional wall motion abnormalities. He did have grade 1 diastolic dysfunction, normal RV function, trivial MR, mild aortic valve regurgitation and mild dilatation of the aortic root measuring 40 mm. Was recommended to obtain a CTA Aorta before his next clinic visit.  In talking with the patient and his wife today, he reports overall doing well from a cardiac perspective since his last office visit. He continues to work part-time for [employer] and also remains very active around his home. He has been doing yard work and cutting down trees without any chest pain or dyspnea on exertion. No recent orthopnea, PND, pitting edema or palpitations. He does request to have a PSA rechecked with his follow-up labs as this was not obtained by his PCP within the past year.  Studies Reviewed:   EKG: EKG is ordered today and demonstrates:   EKG Interpretation Date/Time:  Wednesday October 17 2023 13:58:24 EST Ventricular Rate:  66 PR Interval:  232 QRS Duration:  96 QT Interval:  402 QTC Calculation: 421 R Axis:   19  Text Interpretation: Sinus  rhythm with 1st degree A-V block No acute changes Confirmed by [name] [name] ([id number]) on 10/17/2023 2:07:30 PM       Echocardiogram: 11/2022 IMPRESSIONS     1. Left ventricular ejection fraction, by estimation, is 60 to 65%. The  left ventricle has normal function. The left ventricle has no regional  wall motion abnormalities. There is mild concentric left ventricular  hypertrophy. Left ventricular diastolic  parameters are consistent with Grade I diastolic dysfunction (impaired  relaxation).   2. Right ventricular systolic function is normal. The right ventricular  size is normal. There is normal pulmonary artery systolic pressure. The  estimated right ventricular systolic pressure is 22.2 mmHg.   3. Left atrial size was mildly dilated.   4. The mitral valve is grossly normal. Trivial mitral valve  regurgitation.   5. The aortic valve is tricuspid. There is mild calcification of the  aortic valve. Aortic valve regurgitation is mild.   6. Aortic dilatation noted. There is mild dilatation of the aortic root,  measuring 40 mm.   7. The inferior vena cava is normal in size with greater than 50%  respiratory variability, suggesting right atrial pressure of 3 mmHg.   Comparison(s): Prior images unable to be directly viewed.    Risk Assessment/Calculations:     HYPERTENSION CONTROL Vitals:   10/17/23 1355 10/17/23 1428  BP: (!) 162/72 (!) 142/78    The patient's blood pressure is elevated above target today.  In order to address the patient's elevated BP: Blood pressure will be monitored at  home to determine if medication changes need to be made.     Physical Exam:   VS:  BP (!) 142/78   Pulse 66   Ht 6' (1.829 m)   Wt 198 lb (89.8 kg)   SpO2 97%   BMI 26.85 kg/m    Wt Readings from Last 3 Encounters:  10/17/23 198 lb (89.8 kg)  11/03/22 200 lb 9.6 oz (91 kg)  09/07/21 209 lb 6.4 oz (95 kg)     GEN: Well nourished, well developed male appearing in no acute  distress NECK: No JVD; No carotid bruits CARDIAC: RRR, no murmurs, rubs, gallops RESPIRATORY:  Clear to auscultation without rales, wheezing or rhonchi  ABDOMEN: Appears non-distended. No obvious abdominal masses. EXTREMITIES: No clubbing or cyanosis. No pitting edema.  Distal pedal pulses are 2+ bilaterally.   Assessment and Plan:   1. CAD - He underwent CABG in 02/2009 and did have [name] intermediate-risk NST in 05/2018 with medical management recommended.  - He remains active at baseline and denies any recent anginal symptoms. Continue ASA 81 mg daily and Crestor 20 mg daily.  2. Palpitations  - He had episodes of sinus tachycardia and isolated NSVT by prior monitor. Denies any recent palpitations and symptoms have been well-controlled with short-acting Cardizem 30 mg twice daily. He was previously intolerant to multiple beta-blockers including Coreg, Lopressor and Bystolic.  3. HTN - Blood pressure is elevated and was initially at 162/72, rechecked and at 142/78. He has not been checking this routinely at home. He was provided with a BP log today and encouraged to return this within 4 to 6 weeks. Pending review of this, he may require titration of medical therapy. He is only on short-acting Cardizem 30 mg twice daily for palpitations at this time.  4. HLD - He brought with him today a copy of recent labs by his PCP and LDL was at 64 when checked in 09/2023. Continue Crestor 20 mg daily.  5. Dilatation of the Aortic Root - This measured 40 mm by echocardiogram in 11/2022. Will plan for a follow-up CTA Aorta for surveillance imaging.  6. Screening PSA/Urinary Hesitancy - He reports a PSA has not been checked by his PCP or Urology within the past year and requests to have this with his upcoming labs. Will enter the orders today.  Signed, Ellsworth Lennox, PA-C

## 2023-10-17 NOTE — Patient Instructions (Signed)
Medication Instructions:  Your physician recommends that you continue on your current medications as directed. Please refer to the Current Medication list given to you today.   Labwork: BMET,PSA  Testing/Procedures: Chest CT Aorta  Follow-Up: 1 year  Any Other Special Instructions Will Be Listed Below (If Applicable).   Please keep daily BP log for 1 month and bring back to office for review.  If you need a refill on your cardiac medications before your next appointment, please call your pharmacy.

## 2023-10-18 DIAGNOSIS — C44229 Squamous cell carcinoma of skin of left ear and external auricular canal: Secondary | ICD-10-CM | POA: Diagnosis not present

## 2023-10-18 DIAGNOSIS — C44219 Basal cell carcinoma of skin of left ear and external auricular canal: Secondary | ICD-10-CM | POA: Diagnosis not present

## 2023-10-19 ENCOUNTER — Encounter: Payer: Self-pay | Admitting: Family Medicine

## 2023-11-19 ENCOUNTER — Telehealth: Payer: Self-pay | Admitting: Student

## 2023-11-19 DIAGNOSIS — Z79899 Other long term (current) drug therapy: Secondary | ICD-10-CM

## 2023-11-19 NOTE — Telephone Encounter (Signed)
Pt c/o BP issue: STAT if pt c/o blurred vision, one-sided weakness or slurred speech  1. What are your last 5 BP readings?   176/88 - today  171/84 - today    2. Are you having any other symptoms (ex. Dizziness, headache, blurred vision, passed out)? No   3. What is your BP issue? Pt spouse called in stating pt bp has been elevated today. Please advise.

## 2023-11-19 NOTE — Telephone Encounter (Signed)
Spoke with wife and pt who states that the Pt's BP is elevated. On 11/09/23 BP was 156/83, This morning it was 171/84 Hr 60, and 176/88 Hr 68 this afternoon. Pt has no complaints at this time. Please advise.

## 2023-11-19 NOTE — Telephone Encounter (Signed)
   It was elevated at the time of his office visit in 10/2023 which is why we encouraged him to keep a record of it and report back with readings. Was not that elevated though on recheck. I would recommend he start Losartan 25mg  daily. Keep a BP log and let us know his numbers in 2-3 weeks (can drop a BP log off at the office). Will need a BMET in 2 weeks for medication management. May ultimately need to titrate the dose pending trend.   Signed, Ellsworth Lennox, PA-C 11/19/2023, 8:05 PM

## 2023-11-20 MED ORDER — LOSARTAN POTASSIUM 25 MG PO TABS: 25.0000 mg | ORAL_TABLET | Freq: Every day | ORAL | 3 refills | Status: DC

## 2023-11-20 NOTE — Telephone Encounter (Signed)
Pt's wife notified and was agreeable to pt starting medication, bp log, and lab work. Pt's wife had no further questions or concerns at this time.

## 2023-11-21 ENCOUNTER — Ambulatory Visit (HOSPITAL_COMMUNITY)
Admission: RE | Admit: 2023-11-21 | Discharge: 2023-11-21 | Disposition: A | Discharge status: Home / Self Care | Payer: Medicare Other | Source: Ambulatory Visit | Attending: Student | Admitting: Student

## 2023-11-21 DIAGNOSIS — I77819 Aortic ectasia, unspecified site: Secondary | ICD-10-CM | POA: Diagnosis not present

## 2023-11-21 DIAGNOSIS — I77810 Thoracic aortic ectasia: Secondary | ICD-10-CM | POA: Insufficient documentation

## 2023-11-21 DIAGNOSIS — I708 Atherosclerosis of other arteries: Secondary | ICD-10-CM | POA: Diagnosis not present

## 2023-11-21 DIAGNOSIS — R918 Other nonspecific abnormal finding of lung field: Secondary | ICD-10-CM | POA: Diagnosis not present

## 2023-11-21 DIAGNOSIS — I2510 Atherosclerotic heart disease of native coronary artery without angina pectoris: Secondary | ICD-10-CM | POA: Diagnosis not present

## 2023-11-21 MED ORDER — IOHEXOL 350 MG/ML SOLN
75.0000 mL | Freq: Once | INTRAVENOUS | Status: AC | PRN
  Administered 2023-11-21: 75 mL via INTRAVENOUS

## 2023-12-04 ENCOUNTER — Other Ambulatory Visit (HOSPITAL_COMMUNITY)
Admission: RE | Admit: 2023-12-04 | Discharge: 2023-12-04 | Disposition: A | Discharge status: Home / Self Care | Payer: Medicare Other | Source: Ambulatory Visit | Attending: Student | Admitting: Student

## 2023-12-04 DIAGNOSIS — Z79899 Other long term (current) drug therapy: Secondary | ICD-10-CM | POA: Insufficient documentation

## 2023-12-04 LAB — BASIC METABOLIC PANEL
Anion gap: 10 (ref 5–15)
BUN: 12 mg/dL (ref 8–23)
CO2: 22 mmol/L (ref 22–32)
Calcium: 9.5 mg/dL (ref 8.9–10.3)
Chloride: 104 mmol/L (ref 98–111)
Creatinine, Ser: 0.7 mg/dL (ref 0.61–1.24)
GFR, Estimated: >60 mL/min (ref >60)
Glucose, Bld: 101 mg/dL — ABNORMAL HIGH (ref 70–99)
Potassium: 4 mmol/L (ref 3.5–5.1)
Sodium: 136 mmol/L (ref 135–145)

## 2023-12-26 DIAGNOSIS — L821 Other seborrheic keratosis: Secondary | ICD-10-CM | POA: Diagnosis not present

## 2023-12-26 DIAGNOSIS — Z85828 Personal history of other malignant neoplasm of skin: Secondary | ICD-10-CM | POA: Diagnosis not present

## 2023-12-26 DIAGNOSIS — L570 Actinic keratosis: Secondary | ICD-10-CM | POA: Diagnosis not present

## 2024-01-16 DIAGNOSIS — I10 Essential (primary) hypertension: Secondary | ICD-10-CM | POA: Diagnosis not present

## 2024-01-16 DIAGNOSIS — E782 Mixed hyperlipidemia: Secondary | ICD-10-CM | POA: Diagnosis not present

## 2024-01-16 DIAGNOSIS — Z1329 Encounter for screening for other suspected endocrine disorder: Secondary | ICD-10-CM | POA: Diagnosis not present

## 2024-01-16 DIAGNOSIS — E7849 Other hyperlipidemia: Secondary | ICD-10-CM | POA: Diagnosis not present

## 2024-01-16 DIAGNOSIS — K219 Gastro-esophageal reflux disease without esophagitis: Secondary | ICD-10-CM | POA: Diagnosis not present

## 2024-01-16 DIAGNOSIS — R5383 Other fatigue: Secondary | ICD-10-CM | POA: Diagnosis not present

## 2024-01-30 DIAGNOSIS — N401 Enlarged prostate with lower urinary tract symptoms: Secondary | ICD-10-CM | POA: Diagnosis not present

## 2024-01-30 DIAGNOSIS — I10 Essential (primary) hypertension: Secondary | ICD-10-CM | POA: Diagnosis not present

## 2024-01-30 DIAGNOSIS — Z6827 Body mass index (BMI) 27.0-27.9, adult: Secondary | ICD-10-CM | POA: Diagnosis not present

## 2024-01-30 DIAGNOSIS — D649 Anemia, unspecified: Secondary | ICD-10-CM | POA: Diagnosis not present

## 2024-01-30 DIAGNOSIS — I25119 Atherosclerotic heart disease of native coronary artery with unspecified angina pectoris: Secondary | ICD-10-CM | POA: Diagnosis not present

## 2024-03-13 ENCOUNTER — Ambulatory Visit (HOSPITAL_COMMUNITY)
Admission: RE | Admit: 2024-03-13 | Discharge: 2024-03-13 | Disposition: A | Discharge status: Home / Self Care | Source: Ambulatory Visit | Attending: Student | Admitting: Student

## 2024-03-13 ENCOUNTER — Telehealth: Payer: Self-pay | Admitting: Student

## 2024-03-13 DIAGNOSIS — M25562 Pain in left knee: Secondary | ICD-10-CM | POA: Diagnosis not present

## 2024-03-13 DIAGNOSIS — M79606 Pain in leg, unspecified: Secondary | ICD-10-CM | POA: Diagnosis not present

## 2024-03-13 DIAGNOSIS — M79662 Pain in left lower leg: Secondary | ICD-10-CM | POA: Diagnosis not present

## 2024-03-13 NOTE — Telephone Encounter (Signed)
 Pt spouse called in stating they sent a message via mychart yesterday stating that pt calf has been hurting and they want a test ran. She states pt denies any other symptoms. Please advise.

## 2024-03-13 NOTE — Telephone Encounter (Signed)
 Spoke to pt who stated that for the last 2 weeks his legs/calves have been hurting him when he stands for long periods of time. Pt stated that legs feel better when he walks. Pt stated that there is no swelling, redness. Pt stated that the pain is a throbbing pain.    Please advise.

## 2024-03-13 NOTE — Telephone Encounter (Signed)
 Patient notified and verbalized understanding. Message sent to scheduling staff.

## 2024-03-13 NOTE — Telephone Encounter (Signed)
   I apologize but I did not receive a MyChart message from the patient and do not see one in the system for yesterday. Can order lower extremity dopplers to rule-out DVT. If negative, would need to follow-up with his PCP to investigate other causes.    Signed, Ellsworth Lennox, PA-C 03/13/2024, 8:49 AM Pager: [phone]

## 2024-03-13 NOTE — Addendum Note (Signed)
 Addended by: Roseanne Reno on: 03/13/2024 09:21 AM   Modules accepted: Orders

## 2024-03-13 NOTE — Addendum Note (Signed)
 Addended by: Roseanne Reno on: 03/13/2024 09:36 AM   Modules accepted: Orders

## 2024-03-19 ENCOUNTER — Other Ambulatory Visit (HOSPITAL_COMMUNITY)

## 2024-03-24 DIAGNOSIS — Z6827 Body mass index (BMI) 27.0-27.9, adult: Secondary | ICD-10-CM | POA: Diagnosis not present

## 2024-03-24 DIAGNOSIS — M79661 Pain in right lower leg: Secondary | ICD-10-CM | POA: Diagnosis not present

## 2024-03-27 ENCOUNTER — Encounter

## 2024-04-02 DIAGNOSIS — M1711 Unilateral primary osteoarthritis, right knee: Secondary | ICD-10-CM | POA: Diagnosis not present

## 2024-04-02 DIAGNOSIS — Z6827 Body mass index (BMI) 27.0-27.9, adult: Secondary | ICD-10-CM | POA: Diagnosis not present

## 2024-04-04 ENCOUNTER — Other Ambulatory Visit: Payer: Self-pay | Admitting: Internal Medicine

## 2024-05-07 DIAGNOSIS — R351 Nocturia: Secondary | ICD-10-CM | POA: Diagnosis not present

## 2024-05-07 DIAGNOSIS — N401 Enlarged prostate with lower urinary tract symptoms: Secondary | ICD-10-CM | POA: Diagnosis not present

## 2024-05-21 DIAGNOSIS — M1711 Unilateral primary osteoarthritis, right knee: Secondary | ICD-10-CM | POA: Diagnosis not present

## 2024-05-21 DIAGNOSIS — S86811A Strain of other muscle(s) and tendon(s) at lower leg level, right leg, initial encounter: Secondary | ICD-10-CM | POA: Diagnosis not present

## 2024-05-21 DIAGNOSIS — Z6827 Body mass index (BMI) 27.0-27.9, adult: Secondary | ICD-10-CM | POA: Diagnosis not present

## 2024-05-29 DIAGNOSIS — M175 Other unilateral secondary osteoarthritis of knee: Secondary | ICD-10-CM | POA: Diagnosis not present

## 2024-05-29 DIAGNOSIS — S86111A Strain of other muscle(s) and tendon(s) of posterior muscle group at lower leg level, right leg, initial encounter: Secondary | ICD-10-CM | POA: Diagnosis not present

## 2024-05-29 DIAGNOSIS — M6281 Muscle weakness (generalized): Secondary | ICD-10-CM | POA: Diagnosis not present

## 2024-05-30 ENCOUNTER — Other Ambulatory Visit: Payer: Self-pay | Admitting: Internal Medicine

## 2024-06-02 DIAGNOSIS — S86111A Strain of other muscle(s) and tendon(s) of posterior muscle group at lower leg level, right leg, initial encounter: Secondary | ICD-10-CM | POA: Diagnosis not present

## 2024-06-02 DIAGNOSIS — M175 Other unilateral secondary osteoarthritis of knee: Secondary | ICD-10-CM | POA: Diagnosis not present

## 2024-06-02 DIAGNOSIS — M6281 Muscle weakness (generalized): Secondary | ICD-10-CM | POA: Diagnosis not present

## 2024-06-04 DIAGNOSIS — M175 Other unilateral secondary osteoarthritis of knee: Secondary | ICD-10-CM | POA: Diagnosis not present

## 2024-06-04 DIAGNOSIS — S86111A Strain of other muscle(s) and tendon(s) of posterior muscle group at lower leg level, right leg, initial encounter: Secondary | ICD-10-CM | POA: Diagnosis not present

## 2024-06-04 DIAGNOSIS — M6281 Muscle weakness (generalized): Secondary | ICD-10-CM | POA: Diagnosis not present

## 2024-06-09 ENCOUNTER — Telehealth: Payer: Self-pay | Admitting: Internal Medicine

## 2024-06-09 DIAGNOSIS — M175 Other unilateral secondary osteoarthritis of knee: Secondary | ICD-10-CM | POA: Diagnosis not present

## 2024-06-09 DIAGNOSIS — S86111A Strain of other muscle(s) and tendon(s) of posterior muscle group at lower leg level, right leg, initial encounter: Secondary | ICD-10-CM | POA: Diagnosis not present

## 2024-06-09 DIAGNOSIS — M6281 Muscle weakness (generalized): Secondary | ICD-10-CM | POA: Diagnosis not present

## 2024-06-09 NOTE — Telephone Encounter (Signed)
 Reports checking home blood pressures daily in the mornings, same arm (left) and same cuff. Reports checking blood pressure as soon as he sits down without resting. Medications reviewed. Advised to monitor home blood pressures daily for one week, same arm, same cuff and waiting 5-10 minutes after he sits before checking it. Advised to contact office with readings. Verbalized understanding.

## 2024-06-09 NOTE — Telephone Encounter (Signed)
 Pt c/o BP issue:  1. What are your last 5 BP readings? 132/70; 140/80; 142/81. Are you having any other symptoms    (ex. Dizziness, headache, blurred vision, passed out)? No  3. What is your medication issue? 90

## 2024-06-12 DIAGNOSIS — S86111A Strain of other muscle(s) and tendon(s) of posterior muscle group at lower leg level, right leg, initial encounter: Secondary | ICD-10-CM | POA: Diagnosis not present

## 2024-06-12 DIAGNOSIS — M6281 Muscle weakness (generalized): Secondary | ICD-10-CM | POA: Diagnosis not present

## 2024-06-12 DIAGNOSIS — M175 Other unilateral secondary osteoarthritis of knee: Secondary | ICD-10-CM | POA: Diagnosis not present

## 2024-06-16 DIAGNOSIS — M6281 Muscle weakness (generalized): Secondary | ICD-10-CM | POA: Diagnosis not present

## 2024-06-16 DIAGNOSIS — M175 Other unilateral secondary osteoarthritis of knee: Secondary | ICD-10-CM | POA: Diagnosis not present

## 2024-06-16 DIAGNOSIS — S86111A Strain of other muscle(s) and tendon(s) of posterior muscle group at lower leg level, right leg, initial encounter: Secondary | ICD-10-CM | POA: Diagnosis not present

## 2024-06-18 DIAGNOSIS — M175 Other unilateral secondary osteoarthritis of knee: Secondary | ICD-10-CM | POA: Diagnosis not present

## 2024-06-18 DIAGNOSIS — R262 Difficulty in walking, not elsewhere classified: Secondary | ICD-10-CM | POA: Diagnosis not present

## 2024-06-18 DIAGNOSIS — S86111A Strain of other muscle(s) and tendon(s) of posterior muscle group at lower leg level, right leg, initial encounter: Secondary | ICD-10-CM | POA: Diagnosis not present

## 2024-06-18 DIAGNOSIS — M6281 Muscle weakness (generalized): Secondary | ICD-10-CM | POA: Diagnosis not present

## 2024-07-10 DIAGNOSIS — D485 Neoplasm of uncertain behavior of skin: Secondary | ICD-10-CM | POA: Diagnosis not present

## 2024-07-10 DIAGNOSIS — C44222 Squamous cell carcinoma of skin of right ear and external auricular canal: Secondary | ICD-10-CM | POA: Diagnosis not present

## 2024-07-10 DIAGNOSIS — R208 Other disturbances of skin sensation: Secondary | ICD-10-CM | POA: Diagnosis not present

## 2024-07-10 DIAGNOSIS — L578 Other skin changes due to chronic exposure to nonionizing radiation: Secondary | ICD-10-CM | POA: Diagnosis not present

## 2024-07-23 DIAGNOSIS — R5383 Other fatigue: Secondary | ICD-10-CM | POA: Diagnosis not present

## 2024-07-23 DIAGNOSIS — E1165 Type 2 diabetes mellitus with hyperglycemia: Secondary | ICD-10-CM | POA: Diagnosis not present

## 2024-07-23 DIAGNOSIS — Z1329 Encounter for screening for other suspected endocrine disorder: Secondary | ICD-10-CM | POA: Diagnosis not present

## 2024-07-23 DIAGNOSIS — D649 Anemia, unspecified: Secondary | ICD-10-CM | POA: Diagnosis not present

## 2024-07-23 DIAGNOSIS — E559 Vitamin D deficiency, unspecified: Secondary | ICD-10-CM | POA: Diagnosis not present

## 2024-07-23 DIAGNOSIS — E7849 Other hyperlipidemia: Secondary | ICD-10-CM | POA: Diagnosis not present

## 2024-07-24 ENCOUNTER — Ambulatory Visit: Payer: Self-pay | Admitting: Internal Medicine

## 2024-07-30 DIAGNOSIS — C44222 Squamous cell carcinoma of skin of right ear and external auricular canal: Secondary | ICD-10-CM | POA: Diagnosis not present

## 2024-09-11 ENCOUNTER — Other Ambulatory Visit: Payer: Self-pay | Admitting: Internal Medicine

## 2024-09-17 DIAGNOSIS — Z6827 Body mass index (BMI) 27.0-27.9, adult: Secondary | ICD-10-CM | POA: Diagnosis not present

## 2024-09-17 DIAGNOSIS — I10 Essential (primary) hypertension: Secondary | ICD-10-CM | POA: Diagnosis not present

## 2024-09-17 DIAGNOSIS — N401 Enlarged prostate with lower urinary tract symptoms: Secondary | ICD-10-CM | POA: Diagnosis not present

## 2024-09-17 DIAGNOSIS — D649 Anemia, unspecified: Secondary | ICD-10-CM | POA: Diagnosis not present

## 2024-09-17 DIAGNOSIS — I25119 Atherosclerotic heart disease of native coronary artery with unspecified angina pectoris: Secondary | ICD-10-CM | POA: Diagnosis not present

## 2024-10-19 NOTE — Progress Notes (Unsigned)
 Cardiology Office Note    Date:  10/21/2024  ID:  [name], [name] [birth year], MRN [id number] Cardiologist: Diannah SHAUNNA [name], MD Cardiology APP:  [name] HERO, PA-C { : History of Present Illness:    [name] [name] is a 83 y.o. male with past medical history of CAD (s/p CABG in 02/2009, intermediate-risk NST in 05/2018 with medical management recommended), palpitations (episodes of sinus tachycardia and isolated NSVT by prior monitor), HTN and HLD who presents to the office for annual follow-up.  He was last examined by myself in 10/2023 and was still working part-time for [employer] and remained active at baseline.  BP was elevated and he was encouraged to follow this in the ambulatory setting and return a BP log. He was continued on ASA 81 mg daily, short-acting Cardizem  30 mg twice daily and Crestor  20 mg daily. Blood pressure did remain elevated in the ambulatory setting and he was started on Losartan  25 mg daily.  In talking with the patient and his wife today, he reports overall doing well from a cardiac perspective since his last office visit. He denies any recent chest pain or progressive dyspnea on exertion when doing routine activities around his home. He has recently been getting up leaves and walking up and down a hill to the creek without any anginal symptoms. Says his biggest limiting factor is knee pain. He denies any orthopnea or PND. Does report frequent urination at night and is up 4-5 times at night due to this. He was evaluated by Urology in [place] but requests a referral to a local group  He does bring with him today a copy of recent labs from his PCP which were obtained in 07/2024. They show hemoglobin of 11.5, platelets 243 K, Na+ 141, K+ 4.6, creatinine 0.74, AST 28 and ALT 14. FLP showed total cholesterol 114, triglycerides 46, HDL 43 and LDL 61.  Studies Reviewed:   EKG: EKG is ordered today and demonstrates:   EKG  Interpretation Date/Time:  Tuesday October 21 2024 08:57:51 EST Ventricular Rate:  68 PR Interval:  234 QRS Duration:  96 QT Interval:  406 QTC Calculation: 431 R Axis:   -16  Text Interpretation: Sinus rhythm with 1st degree A-V block When compared with ECG of 17-Oct-2023 13:58, No significant change was found Confirmed by [name] ([id number]) on 10/21/2024 9:03:33 AM       Echocardiogram: 11/2022 IMPRESSIONS     1. Left ventricular ejection fraction, by estimation, is 60 to 65%. The  left ventricle has normal function. The left ventricle has no regional  wall motion abnormalities. There is mild concentric left ventricular  hypertrophy. Left ventricular diastolic  parameters are consistent with Grade I diastolic dysfunction (impaired  relaxation).   2. Right ventricular systolic function is normal. The right ventricular  size is normal. There is normal pulmonary artery systolic pressure. The  estimated right ventricular systolic pressure is 22.2 mmHg.   3. Left atrial size was mildly dilated.   4. The mitral valve is grossly normal. Trivial mitral valve  regurgitation.   5. The aortic valve is tricuspid. There is mild calcification of the  aortic valve. Aortic valve regurgitation is mild.   6. Aortic dilatation noted. There is mild dilatation of the aortic root,  measuring 40 mm.   7. The inferior vena cava is normal in size with greater than 50%  respiratory variability, suggesting right atrial pressure of 3 mmHg.   Comparison(s): Prior images unable to  be directly viewed.    Physical Exam:   VS:  BP 128/68   Pulse 68   Ht 6' (1.829 m)   Wt 206 lb (93.4 kg)   BMI 27.94 kg/m    Wt Readings from Last 3 Encounters:  10/21/24 206 lb (93.4 kg)  10/17/23 198 lb (89.8 kg)  11/03/22 200 lb 9.6 oz (91 kg)     GEN: Well nourished, well developed male appearing in no acute distress NECK: No JVD; No carotid bruits CARDIAC: RRR, no murmurs, rubs,  gallops RESPIRATORY:  Clear to auscultation without rales, wheezing or rhonchi  ABDOMEN: Appears non-distended. No obvious abdominal masses. EXTREMITIES: No clubbing or cyanosis. No pitting edema.  Distal pedal pulses are 2+ bilaterally.   Assessment and Plan:   1. Coronary artery disease involving native coronary artery of native heart without angina pectoris - He previously underwent CABG in 2010 and most recent ischemic evaluation was an NST in 05/2018 with medical management recommended that time.   - He remains very active at baseline and denies any recent anginal symptoms. - Continue current medical therapy with ASA 81 mg daily and Crestor  20 mg daily  2. Essential hypertension - BP is well-controlled at 128/68 during today's visit. Continue current medical therapy with Cardizem  30 mg twice daily and Losartan  25 mg daily. Creatinine was stable at 0.74 by recent labs in 07/2024.  3. Hyperlipidemia LDL goal <70 - LDL was at 61 when recently checked by his PCP.  Continue current medical therapy with Crestor  20 mg daily.  4. Urinary frequency - Reports urinating 4-5 times each night which is very disruptive to his sleep. Has been started on Oxybutynin and Tamsulosin by his PCP and Urology without any improvement. He requests a second opinion with Urology here in [place] and will enter a referral for this.  Signed, [name] CHRISTELLA Qua, PA-C

## 2024-10-21 ENCOUNTER — Ambulatory Visit: Attending: Student | Admitting: Student

## 2024-10-21 ENCOUNTER — Encounter: Payer: Self-pay | Admitting: Student

## 2024-10-21 VITALS — BP 128/68 | HR 68 | Ht 72.0 in | Wt 206.0 lb

## 2024-10-21 DIAGNOSIS — R350 Frequency of micturition: Secondary | ICD-10-CM | POA: Diagnosis not present

## 2024-10-21 DIAGNOSIS — E785 Hyperlipidemia, unspecified: Secondary | ICD-10-CM | POA: Diagnosis not present

## 2024-10-21 DIAGNOSIS — I10 Essential (primary) hypertension: Secondary | ICD-10-CM | POA: Insufficient documentation

## 2024-10-21 DIAGNOSIS — I2510 Atherosclerotic heart disease of native coronary artery without angina pectoris: Secondary | ICD-10-CM | POA: Diagnosis not present

## 2024-10-21 NOTE — Patient Instructions (Signed)
 Medication Instructions:  Your physician recommends that you continue on your current medications as directed. Please refer to the Current Medication list given to you today.  *If you need a refill on your cardiac medications before your next appointment, please call your pharmacy*  Lab Work: None today If you have labs (blood work) drawn today and your tests are completely normal, you will receive your results only by: MyChart Message (if you have MyChart) OR A paper copy in the mail If you have any lab test that is abnormal or we need to change your treatment, we will call you to review the results.  Testing/Procedures: None today  Follow-Up: At [facility], you and your health needs are our priority.  As part of our continuing mission to provide you with exceptional heart care, our providers are all part of one team.  This team includes your primary Cardiologist (physician) and Advanced Practice Providers or APPs (Physician Assistants and Nurse Practitioners) who all work together to provide you with the care you need, when you need it.  Your next appointment:   1 year(s)  Provider:   You may see Vishnu P Mallipeddi, MD or one of the following Advanced Practice Providers on your designated Care Team:   Brittany Strader, PA-C  Scotesia Clyde, [place] Olivia Pavy, [place]     We recommend signing up for the patient portal called MyChart.  Sign up information is provided on this After Visit Summary.  MyChart is used to connect with patients for Virtual Visits (Telemedicine).  Patients are able to view lab/test results, encounter notes, upcoming appointments, etc.  Non-urgent messages can be sent to your provider as well.   To learn more about what you can do with MyChart, go to [email or web address].   Other Instructions Referral placed to urology, they will call you to schedule an appointment.

## 2024-10-31 ENCOUNTER — Other Ambulatory Visit: Payer: Self-pay | Admitting: Student

## 2024-10-31 ENCOUNTER — Other Ambulatory Visit: Payer: Self-pay | Admitting: Internal Medicine

## 2024-12-26 ENCOUNTER — Ambulatory Visit: Admitting: Urology

## 2024-12-26 ENCOUNTER — Encounter: Payer: Self-pay | Admitting: Urology

## 2024-12-26 VITALS — BP 161/67 | HR 65

## 2024-12-26 DIAGNOSIS — N529 Male erectile dysfunction, unspecified: Secondary | ICD-10-CM

## 2024-12-26 DIAGNOSIS — R350 Frequency of micturition: Secondary | ICD-10-CM

## 2024-12-26 DIAGNOSIS — N5201 Erectile dysfunction due to arterial insufficiency: Secondary | ICD-10-CM

## 2024-12-26 LAB — URINALYSIS, ROUTINE W REFLEX MICROSCOPIC
Bilirubin, UA: NEGATIVE
Glucose, UA: NEGATIVE
Ketones, UA: NEGATIVE
Leukocytes,UA: NEGATIVE
Nitrite, UA: NEGATIVE
Protein,UA: NEGATIVE
RBC, UA: NEGATIVE
Specific Gravity, UA: 1.015 (ref 1.005–1.030)
Urobilinogen, Ur: 1 mg/dL (ref 0.2–1.0)
pH, UA: 7.5 (ref 5.0–7.5)

## 2024-12-26 LAB — BLADDER SCAN AMB NON-IMAGING: Scan Result: 30

## 2024-12-26 MED ORDER — TADALAFIL 5 MG PO TABS: 5.0000 mg | ORAL_TABLET | Freq: Every day | ORAL | 11 refills | Status: AC

## 2024-12-26 NOTE — Patient Instructions (Signed)

## 2024-12-26 NOTE — Progress Notes (Signed)
" °  Patient can void prior to the bladder scan. Bladder scan result: 30  Performed By: [facility] LPN  Patient blood pressure value first attempt is high. A repeat blood pressure check completed.  Does patient have a PCP? Yes Has the patient taken then blood pressure medicine today? No  Discussed with patient the importance of reviewing their blood pressure with PCP and adhering to their blood medication is the patient is prescribed any. Patient voiced understanding.  "

## 2024-12-26 NOTE — Progress Notes (Signed)
 "  12/26/2024 11:03 AM   [name] [name] 12/01/1941 [id number]  Referring provider: Johnson Laymon HERO, PA-C [address] [place],  [place] [address]  Urinary frequency  HPI: [name] [name] is a 83yo here for evaluation of urinary frequency. He was previously seen by Dr. Carolee at [facility] urology. IPSS 27 QOl 5 on no BPH therapy. He was previously on flomax 0.4mg  daily and oxybutynin 5mg  without improvement in his urinary frequency, nocturia. He has nocturia 1-2x. He has urinary frequency every 1-2 hours. He drinks 100oz of water  daily. He drinks 20oz of water  and some ginger ale within 2 hours of going to bed. He drinks 1 cup of coffee in the morning. He has had issues getting and maintaining an erection. He previously tried sildenafil which failed to give him a firm erection   PMH: Past Medical History:  Diagnosis Date   CAD (coronary artery disease) of artery bypass graft    a. s/p CABG in 02/2009 b. intermediate-risk NST in 05/2018 with medical management recommended   Hyperlipidemia LDL goal <70    OA (osteoarthritis) of knee     Surgical History: Past Surgical History:  Procedure Laterality Date   COLONOSCOPY N/A 08/03/2017   Procedure: COLONOSCOPY;  Surgeon: Golda Claudis PENNER, MD;  Location: [facility];  Service: Endoscopy;  Laterality: N/A;  730   CORONARY ARTERY BYPASS GRAFT     FLEXIBLE SIGMOIDOSCOPY N/A 08/15/2017   Procedure: FLEXIBLE SIGMOIDOSCOPY;  Surgeon: Golda Claudis PENNER, MD;  Location: [facility];  Service: Endoscopy;  Laterality: N/A;   JOINT REPLACEMENT Left 2012   Left knee   KNEE ARTHROSCOPY      Home Medications:  Allergies as of 12/26/2024       Reactions   Beta Adrenergic Blockers    Rash with Coreg , Lopressor  and Bystolic    Morphine    REACTION: dizziness        Medication List        Accurate as of December 26, 2024 11:03 AM. If you have any questions, ask your nurse or doctor.          aspirin 81 MG tablet Take 81 mg by mouth daily.    diltiazem  30 MG tablet Commonly known as: CARDIZEM  TAKE 1 TABLET BY MOUTH TWICE DAILY   Ferrex 150 150 MG capsule Generic drug: iron polysaccharides Take 150 mg by mouth daily.   Fish Oil 600 MG Caps Take 600 mg by mouth daily.   fluticasone 50 MCG/ACT nasal spray Commonly known as: FLONASE Place 1 spray into the nose at bedtime.   loratadine 10 MG tablet Commonly known as: CLARITIN Take 10 mg by mouth daily.   losartan  25 MG tablet Commonly known as: COZAAR  TAKE 1 TABLET BY MOUTH DAILY   MAGNESIUM COMPLEX PO Take 2 capsules by mouth daily.   multivitamin tablet Take 1 tablet by mouth daily.   nitroGLYCERIN  0.4 MG SL tablet Commonly known as: NITROSTAT  Place 1 tablet (0.4 mg total) under the tongue every 5 (five) minutes as needed.   oxybutynin 5 MG tablet Commonly known as: DITROPAN Take 5 mg by mouth at bedtime.   oxyCODONE-acetaminophen  5-325 MG tablet Commonly known as: PERCOCET/ROXICET Take 1 tablet by mouth every 4 (four) hours as needed for severe pain.   pantoprazole 40 MG tablet Commonly known as: PROTONIX Take 40 mg by mouth daily.   rosuvastatin  20 MG tablet Commonly known as: CRESTOR  TAKE 1 TABLET BY MOUTH EVERY DAY WITH LUNCH   Vitamin  D 50 MCG (2000 UT) Caps Take 1 capsule by mouth daily.        Allergies: Allergies[1]  Family History: Family History  Problem Relation Age of Onset   Coronary artery disease Other    Coronary artery disease Mother    Coronary artery disease Father    Heart attack Father     Social History:  reports that he has never smoked. He has never used smokeless tobacco. He reports that he does not drink alcohol  and does not use drugs.  ROS: All other review of systems were reviewed and are negative except what is noted above in HPI  Physical Exam: BP (!) 161/67   Pulse 65   Constitutional:  Alert and oriented, No acute distress. HEENT: [place] AT, moist mucus membranes.  Trachea midline, no  masses. Cardiovascular: No clubbing, cyanosis, or edema. Respiratory: Normal respiratory effort, no increased work of breathing. GI: Abdomen is soft, nontender, nondistended, no abdominal masses GU: No CVA tenderness. Circumcised phallus. No masses/lesions on penis, testis, scrotum. Prostate 40g smooth no nodules no induration.  Lymph: No cervical or inguinal lymphadenopathy. Skin: No rashes, bruises or suspicious lesions. Neurologic: Grossly intact, no focal deficits, moving all 4 extremities. Psychiatric: Normal mood and affect.  Laboratory Data: Lab Results  Component Value Date   WBC 4.5 11/17/2019   HGB 11.6 (L) 11/17/2019   HCT 35.3 (L) 11/17/2019   MCV 87.8 11/17/2019   PLT 142 (L) 11/17/2019    Lab Results  Component Value Date   CREATININE 0.70 12/04/2023    No results found for: PSA  No results found for: TESTOSTERONE   Lab Results  Component Value Date   HGBA1C  02/09/2009    5.8 (NOTE)   The ADA recommends the following therapeutic goal for glycemic   control related to Hgb A1C measurement:   Goal of Therapy:   < 7.0% Hgb A1C   Reference: American Diabetes Association: Clinical Practice   Recommendations 2008, Diabetes Care,  2008, 31:(Suppl 1).    Urinalysis    Component Value Date/Time   COLORURINE YELLOW 11/17/2019 1210   APPEARANCEUR CLEAR 11/17/2019 1210   LABSPEC 1.019 11/17/2019 1210   PHURINE 5.0 11/17/2019 1210   GLUCOSEU NEGATIVE 11/17/2019 1210   HGBUR NEGATIVE 11/17/2019 1210   BILIRUBINUR NEGATIVE 11/17/2019 1210   KETONESUR NEGATIVE 11/17/2019 1210   PROTEINUR 30 (A) 11/17/2019 1210   UROBILINOGEN 1.0 03/30/2011 1400   NITRITE NEGATIVE 11/17/2019 1210   LEUKOCYTESUR NEGATIVE 11/17/2019 1210    Lab Results  Component Value Date   BACTERIA NONE SEEN 11/17/2019    Pertinent Imaging:  No results found for this or any previous visit.  Results for orders placed during the hospital encounter of 03/13/24  US  Venous Img Lower  Bilateral (DVT)  Narrative CLINICAL DATA:  Knee and calf pain for 2 weeks  EXAM: BILATERAL LOWER EXTREMITY VENOUS DOPPLER ULTRASOUND  TECHNIQUE: Gray-scale sonography with graded compression, as well as color Doppler and duplex ultrasound were performed to evaluate the lower extremity deep venous systems from the level of the common femoral vein and including the common femoral, femoral, profunda femoral, popliteal and calf veins including the posterior tibial, peroneal and gastrocnemius veins when visible. The superficial great saphenous vein was also interrogated. Spectral Doppler was utilized to evaluate flow at rest and with distal augmentation maneuvers in the common femoral, femoral and popliteal veins.  COMPARISON:  None Available.  FINDINGS: RIGHT LOWER EXTREMITY  Common Femoral Vein: No evidence of thrombus. Normal compressibility,  respiratory phasicity and response to augmentation.  Saphenofemoral Junction: No evidence of thrombus. Normal compressibility and flow on color Doppler imaging.  Profunda Femoral Vein: No evidence of thrombus. Normal compressibility and flow on color Doppler imaging.  Femoral Vein: No evidence of thrombus. Normal compressibility, respiratory phasicity and response to augmentation.  Popliteal Vein: No evidence of thrombus. Normal compressibility, respiratory phasicity and response to augmentation.  Calf Veins: No evidence of thrombus. Normal compressibility and flow on color Doppler imaging.  Superficial Great Saphenous Vein: No evidence of thrombus. Normal compressibility.  Venous Reflux:  None.  Other Findings:  None.  LEFT LOWER EXTREMITY  Common Femoral Vein: No evidence of thrombus. Normal compressibility, respiratory phasicity and response to augmentation.  Saphenofemoral Junction: No evidence of thrombus. Normal compressibility and flow on color Doppler imaging.  Profunda Femoral Vein: No evidence of thrombus.  Normal compressibility and flow on color Doppler imaging.  Femoral Vein: No evidence of thrombus. Normal compressibility, respiratory phasicity and response to augmentation.  Popliteal Vein: No evidence of thrombus. Normal compressibility, respiratory phasicity and response to augmentation.  Calf Veins: No evidence of thrombus. Normal compressibility and flow on color Doppler imaging.  Superficial Great Saphenous Vein: No evidence of thrombus. Normal compressibility.  Venous Reflux:  None.  Other Findings: Fluid collection left popliteal fossa measures 5.8 x 1.1 x 3.4 cm. No abnormal flow on Doppler.  IMPRESSION: No evidence of deep venous thrombosis in either lower extremity. Popliteal fossa fluid collection on the left measuring 5.8 x 1.1 x 3.4 cm. Possible Baker's cyst. Confirmatory evaluation as clinically appropriate   Electronically Signed By: Ranell Bring M.D. On: 03/13/2024 11:47  No results found for this or any previous visit.  No results found for this or any previous visit.  No results found for this or any previous visit.  No results found for this or any previous visit.  No results found for this or any previous visit.  No results found for this or any previous visit.   Assessment & Plan:    1. Urinary frequency (Primary) Decrease water  intake to 64-80oz of water  daily -tadalafil 5mg  daily - Urinalysis, Routine w reflex microscopic - BLADDER SCAN AMB NON-IMAGING  2. Erectile dysfunction -tadalafil 5mg  daily   No follow-ups on file.  Belvie Clara, MD  [facility] [place]      [1]  Allergies Allergen Reactions   Beta Adrenergic Blockers     Rash with Coreg , Lopressor  and Bystolic    Morphine     REACTION: dizziness   "

## 2025-03-11 ENCOUNTER — Ambulatory Visit: Admitting: Urology
